# Patient Record
Sex: Female | Born: 1990 | Hispanic: Yes | Marital: Single | Smoking: Never smoker
Health system: Southern US, Community
[De-identification: ages and names within clinical notes are randomized; demographics above are authoritative.]

## PROBLEM LIST (undated history)

## (undated) ENCOUNTER — Emergency Department: Admission: EM

## (undated) ENCOUNTER — Inpatient Hospital Stay: Payer: Self-pay

## (undated) DIAGNOSIS — D649 Anemia, unspecified: Secondary | ICD-10-CM

## (undated) DIAGNOSIS — Z789 Other specified health status: Secondary | ICD-10-CM

## (undated) HISTORY — PX: CYST EXCISION: SHX5701

## (undated) HISTORY — DX: Anemia, unspecified: D64.9

---

## 2010-08-20 ENCOUNTER — Emergency Department: Payer: Self-pay | Admitting: Emergency Medicine

## 2010-09-18 ENCOUNTER — Emergency Department: Payer: Self-pay | Admitting: Unknown Physician Specialty

## 2010-11-18 ENCOUNTER — Ambulatory Visit: Payer: Self-pay | Admitting: Family Medicine

## 2011-01-16 ENCOUNTER — Observation Stay: Payer: Self-pay | Admitting: Obstetrics and Gynecology

## 2011-03-10 ENCOUNTER — Encounter: Payer: Self-pay | Admitting: Maternal and Fetal Medicine

## 2011-04-23 ENCOUNTER — Observation Stay: Payer: Self-pay | Admitting: Obstetrics and Gynecology

## 2011-05-15 ENCOUNTER — Observation Stay: Payer: Self-pay | Admitting: Obstetrics and Gynecology

## 2011-05-23 ENCOUNTER — Inpatient Hospital Stay: Payer: Self-pay

## 2011-11-04 ENCOUNTER — Emergency Department: Payer: Self-pay | Admitting: Emergency Medicine

## 2011-11-04 LAB — URINALYSIS, COMPLETE
Bilirubin,UR: NEGATIVE
Blood: NEGATIVE
Glucose,UR: NEGATIVE mg/dL (ref 0–75)
Leukocyte Esterase: NEGATIVE
Nitrite: NEGATIVE
RBC,UR: 1 /HPF (ref 0–5)
WBC UR: 1 /HPF (ref 0–5)

## 2011-11-04 LAB — COMPREHENSIVE METABOLIC PANEL
Alkaline Phosphatase: 103 U/L (ref 50–136)
Anion Gap: 12 (ref 7–16)
BUN: 14 mg/dL (ref 7–18)
Calcium, Total: 8.3 mg/dL — ABNORMAL LOW (ref 8.5–10.1)
Chloride: 105 mmol/L (ref 98–107)
Co2: 25 mmol/L (ref 21–32)
Creatinine: 0.53 mg/dL — ABNORMAL LOW (ref 0.60–1.30)
EGFR (African American): >60
EGFR (Non-African Amer.): >60
Glucose: 79 mg/dL (ref 65–99)
Osmolality: 283 (ref 275–301)

## 2011-11-04 LAB — CBC
HCT: 34.5 % — ABNORMAL LOW (ref 35.0–47.0)
MCHC: 33.6 g/dL (ref 32.0–36.0)
RBC: 4.12 10*6/uL (ref 3.80–5.20)
RDW: 13.7 % (ref 11.5–14.5)
WBC: 7.4 10*3/uL (ref 3.6–11.0)

## 2011-11-04 LAB — PREGNANCY, URINE: Pregnancy Test, Urine: NEGATIVE m[IU]/mL

## 2012-01-01 ENCOUNTER — Emergency Department: Payer: Self-pay | Admitting: Emergency Medicine

## 2012-01-01 LAB — URINALYSIS, COMPLETE
Glucose,UR: NEGATIVE mg/dL (ref 0–75)
Leukocyte Esterase: NEGATIVE
Nitrite: NEGATIVE
Ph: 5 (ref 4.5–8.0)
Protein: NEGATIVE
RBC,UR: <1 /HPF (ref 0–5)
Specific Gravity: 1.026 (ref 1.003–1.030)
WBC UR: 2 /HPF (ref 0–5)

## 2012-01-01 LAB — COMPREHENSIVE METABOLIC PANEL
Alkaline Phosphatase: 130 U/L (ref 50–136)
BUN: 11 mg/dL (ref 7–18)
Chloride: 106 mmol/L (ref 98–107)
Co2: 27 mmol/L (ref 21–32)
Creatinine: 0.42 mg/dL — ABNORMAL LOW (ref 0.60–1.30)
Osmolality: 274 (ref 275–301)
Potassium: 3.8 mmol/L (ref 3.5–5.1)
SGOT(AST): 16 U/L (ref 15–37)
SGPT (ALT): 18 U/L

## 2012-01-01 LAB — CBC
HCT: 38.1 % (ref 35.0–47.0)
HGB: 12.6 g/dL (ref 12.0–16.0)
MCV: 85 fL (ref 80–100)
RBC: 4.49 10*6/uL (ref 3.80–5.20)
RDW: 13.6 % (ref 11.5–14.5)
WBC: 7.1 10*3/uL (ref 3.6–11.0)

## 2012-01-01 LAB — LIPASE, BLOOD: Lipase: 102 U/L (ref 73–393)

## 2012-01-01 LAB — PREGNANCY, URINE: Pregnancy Test, Urine: NEGATIVE m[IU]/mL

## 2013-01-25 ENCOUNTER — Ambulatory Visit: Payer: Self-pay | Admitting: Advanced Practice Midwife

## 2013-06-29 ENCOUNTER — Inpatient Hospital Stay: Payer: Self-pay | Admitting: Obstetrics and Gynecology

## 2013-06-29 LAB — CBC WITH DIFFERENTIAL/PLATELET
Basophil #: 0.1 10*3/uL (ref 0.0–0.1)
Basophil %: 0.6 %
Eosinophil %: 1.7 %
HGB: 11.4 g/dL — ABNORMAL LOW (ref 12.0–16.0)
Lymphocyte #: 4.4 10*3/uL — ABNORMAL HIGH (ref 1.0–3.6)
Lymphocyte %: 41.5 %
MCHC: 35 g/dL (ref 32.0–36.0)
MCV: 81 fL (ref 80–100)
Monocyte #: 0.6 x10 3/mm (ref 0.2–0.9)
Monocyte %: 5.3 %
Neutrophil %: 50.9 %

## 2013-06-30 LAB — HEMOGLOBIN: HGB: 10.8 g/dL — ABNORMAL LOW (ref 12.0–16.0)

## 2013-08-25 HISTORY — PX: OVARIAN CYST REMOVAL: SHX89

## 2014-08-25 NOTE — L&D Delivery Note (Signed)
Delivery Note At 11:09 AM a viable female was delivered via  (Presentation:ROA , loose nuchal cord  ;  ).  APGAR: 8/9, ; weight  .   Placenta status:in tact  , .  Cord:  with the following complications: . None  Delayed cord clamping  Anesthesia: Epidural  Episiotomy: None Lacerations:  None  Suture Repair: none Est. Blood Loss (mL):  150 cc  Mom to postpartum.  Baby to Couplet care / Skin to Skin.  SCHERMERHORN,THOMAS 05/22/2015, 11:20 AM

## 2014-10-09 ENCOUNTER — Ambulatory Visit: Payer: Self-pay | Admitting: Obstetrics and Gynecology

## 2014-10-13 ENCOUNTER — Ambulatory Visit: Payer: Self-pay | Admitting: Obstetrics and Gynecology

## 2014-10-15 ENCOUNTER — Ambulatory Visit: Payer: Self-pay | Admitting: Obstetrics and Gynecology

## 2014-10-18 ENCOUNTER — Ambulatory Visit: Payer: Self-pay | Admitting: Obstetrics and Gynecology

## 2014-11-11 ENCOUNTER — Emergency Department: Payer: Self-pay | Admitting: Emergency Medicine

## 2014-11-24 LAB — HM PAP SMEAR: HM Pap smear: NEGATIVE

## 2014-11-25 LAB — OB RESULTS CONSOLE RPR: RPR: NONREACTIVE

## 2014-11-25 LAB — OB RESULTS CONSOLE ABO/RH: RH TYPE: POSITIVE

## 2014-11-25 LAB — OB RESULTS CONSOLE ANTIBODY SCREEN: Antibody Screen: NEGATIVE

## 2014-11-25 LAB — OB RESULTS CONSOLE HEPATITIS B SURFACE ANTIGEN: Hepatitis B Surface Ag: NEGATIVE

## 2014-11-25 LAB — OB RESULTS CONSOLE HIV ANTIBODY (ROUTINE TESTING): HIV: NONREACTIVE

## 2014-11-25 LAB — OB RESULTS CONSOLE GC/CHLAMYDIA: GC PROBE AMP, GENITAL: NEGATIVE

## 2014-12-24 NOTE — Op Note (Signed)
PATIENT NAME:  Cindy Fox, Cindy Fox MR#:  [id number] DATE OF BIRTH:  [birth year]  DATE OF PROCEDURE:  10/09/2014  PREOPERATIVE DIAGNOSIS: Suspected right ruptured ectopic pregnancy.  POSTOPERATIVE DIAGNOSIS: Ruptured right ovarian hemorrhagic cyst.   PROCEDURES PERFORMED: 1.  Exam under anesthesia. 2.  Exploratory laparoscopy.  3.  Evacuation of hemoperitoneum.  4.  Fulguration of bleeding on the right ovary.  SURGEON: Christeen Douglas, M.D.   ASSISTANT: Jennell Corner, MD   ANESTHESIA: General.   ESTIMATED BLOOD LOSS: Minimal for the case. Hemoperitoneum evacuated 600 mL of blood.  OPERATIVE FLUIDS: 700 mL.   URINE OUTPUT: 250 mL.   COMPLICATIONS: None.   FINDINGS: Normal ovaries and tubes bilaterally. Significant blood in the pelvis and in the bilateral upper quadrants. No appendix visualized, but notable adhesions in the right lower quadrant.  SPECIMEN TYPE: None.   CONDITION: Stable to PACU.   INDICATION FOR PROCEDURE: Ms. Cindy Fox is a 23 year old gravida 3, para 2-0-02 who was woken this morning by significant abdominal pain. She was found on imaging and exam in the Emergency Room to have significant hemoperitoneum and a 2 x 2 x 3 cm cyst on the right side with concerns for ectopic pregnancy. She was consented for a diagnostic laparoscopy and brought to the operating room in guarded condition. She did have a decrease in her hemoglobin from 11 to 9 while awaiting diagnosis and surgery.  DESCRIPTION OF PROCEDURE: The patient was taken to the operating room where she was identified by name and birthdate. She was placed under general anesthesia without difficulty. She was then repositioned in a dorsal lithotomy position and prepped and draped in the usual sterile fashion. A formal timeout procedure was performed with all team members present and in agreement.   A weighted speculum was placed in the vagina. The anterior lip of the cervix was grasped with a single-tooth  tenaculum after exam under anesthesia revealed no significant abnormalities and a small, mobile uterus. An acorn uterine manipulator was placed in the cervical canal and secured in place. The weighted speculum was removed from the vagina and the surgeon re-gloved.  Attention was turned to the abdomen where a supraumbilical incision was made. The peritoneal cavity was entered under direct visualization and significant blood was noted throughout. The 5 mm port was placed in the right lower quadrant and a 10 mm port was placed in the left lower quadrant under direct visualization. The patient was placed in Trendelenburg. Her pelvis was examined and evacuation of significant clots revealed bilateral adnexa with normal appearing fallopian tubes and a left ovary. On the right side, there was an approximately 3 x 3 cm portion of her right ovary that was actively bleeding. Kleppinger fulguration was used to control the bleeding at the site. The pelvis was irrigated fully. The patient was placed in reverse Trendelenburg to assist in draining the remaining blood down to her pelvis, which was then suctioned out. Attention was returned to the ovary. Small areas of bleeding were noted when the peritoneal pressure was reduced and the ovary was off tension. These areas again were fulgurated and the ovary watched for bleeding. When hemostasis was assured, Arista anticoagulant powder was placed on the ovary. All the blood that could be reached was suctioned out of the pelvis. Pictures were taken of the bilateral adnexa. The patient was leveled out and the trocars were removed under direct visualization. The camera port was closed at the fascial line using 0 Vicryl suture, and the skin was  closed using 4-0 Vicryl for excellent cosmetic effect. The patient tolerated these procedures well, general anesthesia was reversed without difficulty and she is stable in PAC-U.  The plan will be for overnight observation with a repeat of her  hemoglobin and serial beta hCGs.   Of note, a D and C was not performed as the patient had not been consented to this procedure.    ____________________________ Cline CoolsBethany E. Turki Tapanes, MD beb:sb D: 10/09/2014 12:30:02 ET T: 10/09/2014 14:16:10 ET JOB#: [id number]  cc: Cline CoolsBethany E. Brayn Eckstein, MD, <Dictator> Cline CoolsBETHANY E Khayree Delellis MD ELECTRONICALLY SIGNED 11/03/2014 7:37

## 2014-12-24 NOTE — H&P (Signed)
Subjective/Chief Complaint Abdominal pain   History of Present Illness 23yo G3P2002 at 4+1wks by LMP of 09/10/14 presenting with acute onset abdominal and lower pelvic pain at 0130 this morning. No vomiting, some nausea. No SOB or chest pain. No vaginal bleeding. Was trying to conceive. Her pain is crampy and sharp, radiates from midpelvis to RUQ, and is 10/10.   In ER, WBC elevated to 15, H/H stable on first draw at 11/34. Urine pregnancy test neg but serum beta 131.She is afebrile, not tachycardic and is normotensive. CT scan concerning for moderate hemoperitoneum with blood near the liver, a 2.3cm structure on the ovary which might be a corpus luteum cyst. Normal appendix. Ultrasound confirmed complex fluid throughout pelvis.   Outpatient Meds: None Allergies: NKDA   Past History PMH: None PSH: 2 NSVD Past Gyn Hx: No hx of STDs. Normal regular periods. Past OB hx: 2 NSVD at term, no complications, no bleeding   Code Status Full Code   Past Med/Surgical Hx:  Denies medical history:   denies:   Denies surgical history.:   ALLERGIES:  No Known Allergies:   Family and Social History:  Family History Non-Contributory   Place of Living Home   Review of Systems:  Fever/Chills No   Cough No   Sputum No   Abdominal Pain Yes   Diarrhea No   Constipation No   Nausea/Vomiting Yes   SOB/DOE No   Chest Pain No   Dysuria No   Physical Exam:  GEN in moderate distress   HEENT PERRL   RESP normal resp effort  clear BS   CARD regular rate   ABD positive tenderness  positive liver/spleen enlargement  Rovsing positive   SKIN normal to palpation, skin turgor good   NEURO motor/sensory function intact   PSYCH alert, A+O to time, place, person, good insight, anxious   Additional Comments Pelvic exam performed by ER physician with CMT, pain to palpation throughout pelvis, no vaginal bleeding, no cervical lesions.   Lab Results: Hepatic:  15-Feb-16 02:10    Bilirubin, Total  0.1  Alkaline Phosphatase 71  SGPT (ALT) 15  SGOT (AST) 22  Albumin, Serum  3.3  Routine Micro:  15-Feb-16 05:08   Micro Text Report WET PREP   COMMENT                   FEW WHITE BLOOD CELLS SEEN   COMMENT                   NO TRICHOMONAS,SPERMATOZOA,YEAST,OR CLUE CELLS SEEN   ANTIBIOTIC                       Micro Text Report CHLAM/N.GC RT-PCR ([facility])   CHLAMYDIA                 CHLAMYDIA TRACHOMATIS NEGATIVE   N.GONORRHOEAE             N.GONORRHOEAE NEGATIVE   ANTIBIOTIC                       Comment 2. NO TRICHOMONAS,SPERMATOZOA,YEAST,OR CLUE CELLS SEEN  Result(s) reported on 09 Oct 2014 at 05:30AM.  Routine Chem:  15-Feb-16 02:10   HCG Betasubunit Quant. Serum  131 (1-3  (International Unit)  ----------------- Non-pregnant <5 Weeks Post LMP mIU/mL  3- 4 wk 9 - 130  4- 5 wk 75 - 2,600  5- 6 wk 850 - 20,800  6- 7  wk 4,000 - 100,000  7-12 wk 11,500 - 289,000 12-16 wk 18,000 - 137,000 16-29 wk 1,400 - 53,000 29-41 wk 940 - 60,000)  BUN 15  Creatinine (comp) 0.72  Calcium (Total), Serum  8.1  Lipase 105 (Result(s) reported on 09 Oct 2014 at 03:06AM.)  Routine UA:  15-Feb-16 02:10   Color (UA) Yellow  Glucose (UA) 50 mg/dL  Ketones (UA) Trace  Specific Gravity (UA) 1.028  Blood (UA) Negative  Protein (UA) 100 mg/dL  Nitrite (UA) Negative  Leukocyte Esterase (UA) Negative (Result(s) reported on 09 Oct 2014 at 02:41AM.)  Routine Hem:  15-Feb-16 02:10   WBC (CBC)  15.2  Hemoglobin (CBC)  11.2  Hematocrit (CBC)  34.2  Platelet Count (CBC) 293    07:52   WBC (CBC)  12.4  Hemoglobin (CBC)  9.4  Hematocrit (CBC)  28.5  Platelet Count (CBC) 236 (Result(s) reported on 09 Oct 2014 at [facility]08:07AM.)   Radiology Results: US:    15-Feb-16 07:20, US OB Less Than 14 Weeks w/ Transvaginal  US OB Less Than 14 Weeks w/ Transvaginal  REASON FOR EXAM:    severe lower abd pain, hemoperitoneum, +HCG  COMMENTS:       PROCEDURE: US  - US OB LESS THAN 14  WEEKS/W TRANS  - Oct 09 2014  7:20AM     CLINICAL DATA:  Hemoperitoneum.  Positive beta HCG    EXAM:  OBSTETRIC <14 WK US AND TRANSVAGINAL OB US    TECHNIQUE:  Both transabdominal and transvaginal ultrasound examinations were  performed for complete evaluation of the gestation as well as the  maternal uterus, adnexal regions, and pelvic cul-de-sac.  Transvaginal technique was performed to assess early pregnancy.  COMPARISON:  CT abdomen and pelvis obtained earlier in the day    FINDINGS:  Intrauterine gestational sac: Not visualized    Yolk sac:  Not visualized    Embryo:  Not visualized    Maternal uterus/adnexae: There is no intrauterine mass or gestation  appreciable. The endometrium measures 15 mm in thickness with a  smooth contour. There is hemoperitoneum with complex fluid  throughout the pelvis. A small amount of fluid extends to the level  of the right hepatorenal fossa. It is difficult to completely  discern the ovaries from the surrounding hemorrhage. There is a  cystic structure in the right adnexum measuring 2.5 x 2.3 x 2.8 cm.  This structure most likely represents a corpus luteum. Conceivably,  it could represent a distorted gestational sac.     IMPRESSION:  No intrauterine gestation seen. Hemoperitoneum. Ectopic gestation  must be of concern in this circumstance. A cystic structure in the  right ovary may represent corpus luteum. Conceivably, a distorted  gestational sac could present in this manner.    Critical Value/emergent results were called by telephone at the time  of interpretation on 10/09/2014 at 7:40 am to Dr. Loleta RoseORY FORBACH , who  verbally acknowledged these results.    Electronically Signed    By: Bretta BangWilliam  Woodruff III M.D.    On: 10/09/2014 07:40         Verified By: Rutherford GuysWILLIAM W. WOODRUFF, M.D.,  CT:    15-Feb-16 04:53, CT Abdomen and Pelvis With Contrast  CT Abdomen and Pelvis With Contrast  REASON FOR EXAM:    (1) severe suprapubic pain w/  leukocytosis; (2)   severe suprapubic pain w/ leukoc  COMMENTS:       PROCEDURE: CT  - CT ABDOMEN / PELVIS  W  -  Oct 09 2014  4:53AM     CLINICAL DATA:  Suprapubic pain radiating to back, increased urinary  frequency. Similar symptoms 2 years ago without diagnosis.    EXAM:  CT ABDOMEN AND PELVIS WITH CONTRAST    TECHNIQUE:  Multidetector CT imaging of the abdomen and pelvis was performed  using the standard protocol following bolus administrationof  intravenous contrast.    CONTRAST:  100 cc Omnipaque 300    COMPARISON:  CT of the abdomen and pelvis November 04, 2011    FINDINGS:  LUNG BASES: Included view of the lung bases are clear. Visualized  heart and pericardium are unremarkable.    SOLIDORGANS: The liver, spleen, gallbladder, pancreas and adrenal  glands are unremarkable.    GASTROINTESTINAL TRACT: The stomach, small and large bowel are  normal in course and caliber without inflammatory changes. Enteric  contrast has not yet reachedthe distal small bowel. Normal  appendix.    KIDNEYS/ URINARY TRACT: Kidneys are orthotopic, demonstrating  symmetric enhancement. No nephrolithiasis, hydronephrosis or solid  renal masses. The unopacified ureters are normal in course and  caliber. Urinary bladder is partially distended and unremarkable.    PERITONEUM/RETROPERITONEUM: Aortoiliac vessels are normal in course  and caliber. No lymphadenopathy by CT size criteria. Moderate amount  of intermediate density fluid in the pelvis, trackingto the upper  quadrants. Apparent collapsing RIGHT adnexal cyst measuring up to  2.3 cm. However, fluid is predominately in the LEFT pelvis.  Prominent pelvic veins without definite contrast extravasation  though, not tailored for evaluation.  SOFT TISSUE/OSSEOUS STRUCTURES: Non-suspicious.     IMPRESSION:  Moderate amount of hemo peritoneum, Predominately in the pelvis,  tracking into the upper quadrant. Suspected RIGHT adnexal  ruptured  hemorrhagic cyst. Recommend pelvic ultrasound for further  characterization an, consider correlation with beta HCG to exclude  ruptured ectopic pregnancy.      Electronically Signed    By: Awilda Metro    On: 10/09/2014 05:06       Verified By: Dorris Carnes, M.D.,    Assessment/Admission Diagnosis Suspected ruptured ectopic pregnancy   Plan - Repeat CBC, serial vital signs - Consented for urgent OR: exploratory laparoscopy for evacutation of hemoperitoneum, removal of ectopic pregnancy with all associated procedures, including salpingectomy, oopherorectomy and laparotomy as needed. - IVF, pain control PRN. NPO.   Electronic Signatures: Cline Cools (MD)  (Signed 15-Feb-16 08:35)  Authored: CHIEF COMPLAINT and HISTORY, PAST MEDICAL/SURGIAL HISTORY, ALLERGIES, FAMILY AND SOCIAL HISTORY, REVIEW OF SYSTEMS, PHYSICAL EXAM, LABS, Radiology, ASSESSMENT AND PLAN   Last Updated: 15-Feb-16 08:35 by Cline Cools (MD)

## 2015-01-19 ENCOUNTER — Other Ambulatory Visit: Payer: Self-pay | Admitting: Advanced Practice Midwife

## 2015-01-19 DIAGNOSIS — Z3689 Encounter for other specified antenatal screening: Secondary | ICD-10-CM

## 2015-01-23 ENCOUNTER — Ambulatory Visit
Admission: RE | Admit: 2015-01-23 | Discharge: 2015-01-23 | Disposition: A | Discharge status: Home / Self Care | Payer: Medicaid Other | Source: Ambulatory Visit | Attending: Advanced Practice Midwife | Admitting: Advanced Practice Midwife

## 2015-01-23 DIAGNOSIS — Z36 Encounter for antenatal screening of mother: Secondary | ICD-10-CM | POA: Insufficient documentation

## 2015-01-23 DIAGNOSIS — Z3689 Encounter for other specified antenatal screening: Secondary | ICD-10-CM

## 2015-01-23 DIAGNOSIS — Z3A19 19 weeks gestation of pregnancy: Secondary | ICD-10-CM | POA: Insufficient documentation

## 2015-04-02 LAB — OB RESULTS CONSOLE HIV ANTIBODY (ROUTINE TESTING): HIV: NONREACTIVE

## 2015-04-02 LAB — HM HIV SCREENING LAB: HM HIV Screening: NEGATIVE

## 2015-05-15 ENCOUNTER — Other Ambulatory Visit: Payer: Self-pay | Admitting: Advanced Practice Midwife

## 2015-05-15 DIAGNOSIS — Z3493 Encounter for supervision of normal pregnancy, unspecified, third trimester: Secondary | ICD-10-CM

## 2015-05-18 ENCOUNTER — Observation Stay
Admission: EM | Admit: 2015-05-18 | Discharge: 2015-05-18 | Disposition: A | Discharge status: Home / Self Care | Payer: Self-pay | Attending: Obstetrics and Gynecology | Admitting: Obstetrics and Gynecology

## 2015-05-18 ENCOUNTER — Ambulatory Visit
Admission: RE | Admit: 2015-05-18 | Discharge: 2015-05-18 | Disposition: A | Discharge status: Home / Self Care | Payer: Medicaid Other | Source: Ambulatory Visit | Attending: Advanced Practice Midwife | Admitting: Advanced Practice Midwife

## 2015-05-18 ENCOUNTER — Encounter: Payer: Self-pay | Admitting: *Deleted

## 2015-05-18 DIAGNOSIS — Z36 Encounter for antenatal screening of mother: Secondary | ICD-10-CM | POA: Insufficient documentation

## 2015-05-18 DIAGNOSIS — IMO0002 Reserved for concepts with insufficient information to code with codable children: Secondary | ICD-10-CM

## 2015-05-18 DIAGNOSIS — Z3493 Encounter for supervision of normal pregnancy, unspecified, third trimester: Secondary | ICD-10-CM

## 2015-05-18 DIAGNOSIS — O365990 Maternal care for other known or suspected poor fetal growth, unspecified trimester, not applicable or unspecified: Secondary | ICD-10-CM | POA: Diagnosis present

## 2015-05-18 DIAGNOSIS — O365930 Maternal care for other known or suspected poor fetal growth, third trimester, not applicable or unspecified: Principal | ICD-10-CM | POA: Insufficient documentation

## 2015-05-18 DIAGNOSIS — Z3A36 36 weeks gestation of pregnancy: Secondary | ICD-10-CM | POA: Insufficient documentation

## 2015-05-18 HISTORY — DX: Other specified health status: Z78.9

## 2015-05-18 MED ORDER — BETAMETHASONE SOD PHOS & ACET 6 (3-3) MG/ML IJ SUSP
INTRAMUSCULAR | Status: AC
  Administered 2015-05-18: 12 mg via INTRAMUSCULAR
  Filled 2015-05-18: qty 1

## 2015-05-18 MED ORDER — BETAMETHASONE SOD PHOS & ACET 6 (3-3) MG/ML IJ SUSP
12.0000 mg | Freq: Once | INTRAMUSCULAR | Status: AC
  Administered 2015-05-18: 12 mg via INTRAMUSCULAR

## 2015-05-18 MED ORDER — BETAMETHASONE SOD PHOS & ACET 6 (3-3) MG/ML IJ SUSP: 12.0000 mg | Freq: Once | INTRAMUSCULAR | Status: DC

## 2015-05-18 NOTE — OB Triage Provider Note (Signed)
History     CSN: [id number]  Arrival date and time: 05/18/15 1631   None     Chief Complaint  Patient presents with  . Non-stress Test    IUGR   HPI: With interpreter at bedside  Pt. Is a 24 year old G3P2 at 35+[redacted] weeks gestation.  Received a call by Erroll Luna from [facility] Department for this patient with poor weight gain and growth ultrasound at [facility] on 05/18/15 showing EFW in the 5th% consistent with IUGR. Pt. Was sent to labor and delivery triage for an NST, BPP, and to schedule UA Dopplers.  Pt. Reports she was started on Ensure drinks for poor weight gain.  She reports she has had some persistent morning nausea throughout the pregnancy, but reports eating appropriate amounts of proteins, fruits, vegetables.  Pt. Denies being vegetarian.  Pt. And husband report that the other two children were around 7# when they were born.   OB History    Gravida Para Term Preterm AB TAB SAB Ectopic Multiple Living   Past Medical History  Diagnosis Date  . Medical history non-contributory     Past Surgical History  Procedure Laterality Date  . Ovarian cyst removal  2015    History reviewed. No pertinent family history.  Social History  Substance Use Topics  . Smoking status: Never Smoker   . Smokeless tobacco: None  . Alcohol Use: No    Allergies: No Known Allergies  Prescriptions prior to admission  Medication Sig Dispense Refill Last Dose  . Prenatal Vit-Fe Fumarate-FA (PRENATAL MULTIVITAMIN) TABS tablet Take 1 tablet by mouth daily at 12 noon.       Review of Systems  Constitutional: Negative for fever, chills, weight loss and malaise/fatigue.  HENT: Negative for congestion.   Eyes: Negative for blurred vision.  Respiratory: Negative for cough, shortness of breath and wheezing.   Cardiovascular: Negative for chest pain and palpitations.  Gastrointestinal: Positive for nausea. Negative for heartburn, vomiting, abdominal pain and  diarrhea.  Genitourinary: Negative for dysuria, urgency, frequency and hematuria.  Musculoskeletal: Negative for myalgias, back pain, joint pain, falls and neck pain.  Skin: Negative for itching and rash.  Neurological: Negative for dizziness, seizures and headaches.  Endo/Heme/Allergies: Does not bruise/bleed easily.  Psychiatric/Behavioral: Negative for depression. The patient is not nervous/anxious.   No vaginal bleeding / LOF +good fetal movement / occasional contractions with pelvic pressure - but nothing new   Physical Exam   Blood pressure 100/60, pulse 78, temperature 98.6 F (37 C), temperature source Oral, resp. rate 18, height  (1.676 m), weight 57.153 kg (126 lb), last menstrual period 09/10/2014.  Physical Exam  Constitutional: She is oriented to person, place, and time. She appears well-developed. No distress.  HENT:  Head: Normocephalic.  Eyes: Pupils are equal, round, and reactive to light.  Neck: Normal range of motion.  Cardiovascular: Normal rate and regular rhythm.  Exam reveals no gallop and no friction rub.   No murmur heard. Respiratory: Effort normal and breath sounds normal. No respiratory distress. She has no wheezes. She has no rales. She exhibits no tenderness.  GI: Soft. Bowel sounds are normal. She exhibits no distension. There is no tenderness.  Genitourinary: No vaginal discharge found.  Musculoskeletal: Normal range of motion. She exhibits no edema or tenderness.  Neurological: She is alert and oriented to person, place, and time. She has normal reflexes.  Skin: Skin is warm and dry.  Psychiatric: She has a normal mood and affect.   Fundal height: 31cm   [place] results reviewed:  Vtx Movement present HR: 133bmp AFI: WNL EFW: 2190g / 5th%  Fetal Monitoring Assessment: Baseline: 120bmp / Moderate variability / +15x15 accels / no decels Toco: irregular contractions     Procedures  Continuous Monitoring   Assessment and Plan  IUP at 35+5  weeks with IUGR in the 5th% Continuous Monitoring Discussed plan of care with Dr. Dalbert Garnet - Formal BPP and UA dopplers not capable at [facility] this evening, but modified BPP reassuring at this time Determine time/date of when we can schedule UA dopplers to monitor umbilical cord blood flow Betamethasone  IM x1, then repeat in 24 hours   05/18/15 @ 2030  1. IUP at 35+5 weeks with IUGR in the 5th% 2. Category 1 Fetal Monitoring tracing 3. Pt. To return to [facility] labor and delivery for 2nd betamethasone tomorrow night at 8:00pm 4. After reviewing all options and risks and benefits of needing the Doppler Study with patient and her husband via the interpreter which includes: going to [facility] triage tomorrow to have doppler studies done, coming back to [facility] for a [facility] consult and doppler study on Monday, or coming back to [facility] labor and delivery tomorrow during the day to have Dr. Dalbert Garnet and and ultrasound-trained nurse help perform Doppler Study - the patient and her husband elect to return to [facility] for a [facility] consult and doppler study on Monday because they only have 1 car for transportation and the patient's husband has to work during the days including weekends, in addition to having 2 small children at home  5. Discharge to home and return tomorrow for BMZ 6. FKC's daily and handout given 7. Preterm labor precautions reviewed 8. Will call patient Monday morning with the confirmation appointment with [facility]  Dr. Dalbert Garnet aware and updated and agrees and co-managed plan of care  Karena Addison CNM 05/18/2015, 9:02 PM

## 2015-05-18 NOTE — Progress Notes (Signed)
CNM at bedside with patient and interrupter.   EFM off.

## 2015-05-18 NOTE — OB Triage Note (Signed)
Sent from health department for severe IUGR with NST

## 2015-05-18 NOTE — Discharge Instructions (Signed)
Restricción del crecimiento intrauterino °(Intrauterine Growth Restriction) °Se refiere al crecimiento deficiente de un bebé en un momento del embarazo o al nacer. No debe confundirse con "pequeño para la edad gestacional", que significa que el peso del bebé al nacer está en el extremo más bajo) menos del 10% de la curva de pesos normales.  °CAUSAS °Problemas médicos en la madre: °· Presión arterial elevada. °· Enfermedad renal, pulmonar o cardíaca. °· Diabetes con arteriosclerosis. °· Hemoglobinopatías, enfermedades de la sangre. °· Síndrome de anticuerpos antifosfolípidos, un trastorno del sistema inmunológico. °Otras causas: °· Consumo de drogas, hábito de fumar e ingesta de alcohol excesiva. °· Enfermedades de la placenta. °· Embarazo de gemelos o más bebés. °· Desnutrición °· Infecciones. °· Problemas genéticos. °· Embarazo a los 16 años o menos, o en mujeres de más de 35 años. °· Exposición a sustancias químicas. °SÍNTOMAS °· Útero más pequeño que lo normal, al medir su tamaño sobre el abdomen. °· Si al medir por medio de una ecografía de la circunferencia cefálica del feto, la circunferencia abdominal, el diámetro del área biparietal (lados) y el largo del fémur se obtienen valores menores a los normales, hay indicios de restricción del desarrollo intrauterino. °RIESGOS Y COMPLICACIONES °· Muerte fetal dentro del útero o bebé nacido muerto. °· Insuficiencia de líquido amniótico en la bolsa del bebé (oligohidramnios). °· Problemas en la frecuencia cardíaca fetal. Esto ocasiona más partos por cesárea. °· Bajas puntuaciones de Apgar (evaluación del estado del bebé en el momento del nacimiento). °· Aumento en la acidez de la sangre del bebé (acidosis). °Los niños de bajo peso al nacer también pueden sufrir las siguientes complicaciones: °· Bajo nivel de azúcar en la sangre. °· Aumento de la bilirrubina. °· Baja temperatura corporal (hipotermia). °· Bajas puntuaciones de Apgar, convulsiones, muerte fetal y muerte  al nacer. °TRATAMIENTO °· Controles y seguimiento estrechos del feto durante el embarazo. °· Tratamiento del las infecciones, si existieran. °· Tratamiento y control de las enfermedades existentes. °· Observación del estado del feto con pruebas de no estrés, pruebas de estrés por contracciones y perfil biofísico del feto. °· La ecografía Doppler (medición del flujo de la arteria umbilical) disminuye el riesgo de muerte fetal y muerte al nacer, permitiendo un parto anticipado en caso de ser anormal. °INSTRUCCIONES PARA EL CUIDADO DOMICILIARIO °· Siga los consejos y las instrucciones de su médico, y cumpla con todos los controles prenatales. °· Descanse y duerma lo suficiente. °· Consuma una dieta balanceada y tome todas sus suplementos de vitaminas y minerales. °· No haga un uso excesivo de su energía en actividades físicas, trabajo o actividades domésticas extenuantes. °· No practique actividad física excepto que el médico la autorice. °· No fume, no beba alcohol, ni consuma drogas. °· Evite el contacto con sustancias químicas, como pesticidas. °SOLICITE ATENCIÓN MÉDICA SI: °· La temperatura se eleva por encima de 100° F (37.9° C). °· No siente los movimientos del bebé o estos son menos frecuentes. °· Tiene una pérdida de líquido por la vagina. °· Presenta una hemorragia vaginal abundante. °· Siente dolor abdominal. °· Tiene contracciones uterinas. °Document Released: 07/24/2008 Document Revised: 11/03/2011 °Exit[employer]® Patient Information ©2015 [employer]. This information is not intended to replace advice given to you by your health care provider. Make sure you discuss any questions you have with your health care provider. ° °

## 2015-05-18 NOTE — Progress Notes (Signed)
Pt given 1st dose of BMZ in right hip.   Instructions given in spanish to come back tomorrow night for dose #2.

## 2015-05-18 NOTE — Progress Notes (Signed)
Pt given d/c instructions with interrupter.   She is aware to come back tomorrow night for another dose. CNM will call her on Monday to come in around 4pm to see [facility] here.   Pt was in stable condition and ambulated out with husband and her children.

## 2015-05-19 ENCOUNTER — Ambulatory Visit
Admission: RE | Admit: 2015-05-19 | Discharge: 2015-05-19 | Disposition: A | Discharge status: Home / Self Care | Payer: Self-pay | Source: Ambulatory Visit | Attending: Obstetrics and Gynecology | Admitting: Obstetrics and Gynecology

## 2015-05-19 DIAGNOSIS — O365930 Maternal care for other known or suspected poor fetal growth, third trimester, not applicable or unspecified: Secondary | ICD-10-CM | POA: Insufficient documentation

## 2015-05-19 DIAGNOSIS — Z3A35 35 weeks gestation of pregnancy: Secondary | ICD-10-CM | POA: Insufficient documentation

## 2015-05-19 MED ORDER — BETAMETHASONE SOD PHOS & ACET 6 (3-3) MG/ML IJ SUSP
12.0000 mg | Freq: Once | INTRAMUSCULAR | Status: AC
  Administered 2015-05-19: 12 mg via INTRAMUSCULAR

## 2015-05-19 NOTE — Progress Notes (Signed)
Pt, spouse and sons left department walking to Er with Unit secretary.

## 2015-05-19 NOTE — Progress Notes (Signed)
Pt reporting for her medication appointment re:: second Betamethasone injection. Pt and spouse everything is fine, no problems , for [place] on Monday.

## 2015-05-19 NOTE — ED Notes (Signed)
Pt arrived as directed by MD last evening for "steroid injection"; taken to labor and delivery unit

## 2015-05-21 ENCOUNTER — Ambulatory Visit
Admission: RE | Admit: 2015-05-21 | Discharge: 2015-05-21 | Disposition: A | Discharge status: Home / Self Care | Payer: Self-pay | Source: Ambulatory Visit | Attending: Obstetrics and Gynecology | Admitting: Obstetrics and Gynecology

## 2015-05-21 ENCOUNTER — Inpatient Hospital Stay
Admission: EM | Admit: 2015-05-21 | Discharge: 2015-05-23 | DRG: 775 | Disposition: A | Discharge status: Home / Self Care | Payer: Medicaid Other | Attending: Obstetrics and Gynecology | Admitting: Obstetrics and Gynecology

## 2015-05-21 ENCOUNTER — Encounter: Payer: Self-pay | Admitting: Obstetrics and Gynecology

## 2015-05-21 VITALS — BP 98/62 | HR 78 | Temp 98.2°F | Wt 125.0 lb

## 2015-05-21 DIAGNOSIS — O365930 Maternal care for other known or suspected poor fetal growth, third trimester, not applicable or unspecified: Secondary | ICD-10-CM | POA: Insufficient documentation

## 2015-05-21 DIAGNOSIS — O4103X0 Oligohydramnios, third trimester, not applicable or unspecified: Secondary | ICD-10-CM | POA: Insufficient documentation

## 2015-05-21 DIAGNOSIS — Z3A35 35 weeks gestation of pregnancy: Secondary | ICD-10-CM | POA: Insufficient documentation

## 2015-05-21 DIAGNOSIS — O365990 Maternal care for other known or suspected poor fetal growth, unspecified trimester, not applicable or unspecified: Secondary | ICD-10-CM | POA: Diagnosis present

## 2015-05-21 LAB — CBC
HCT: 33.1 % — ABNORMAL LOW (ref 35.0–47.0)
Hemoglobin: 10.8 g/dL — ABNORMAL LOW (ref 12.0–16.0)
MCH: 28.4 pg (ref 26.0–34.0)
MCHC: 32.8 g/dL (ref 32.0–36.0)
MCV: 86.6 fL (ref 80.0–100.0)
Platelets: 231 10*3/uL (ref 150–440)
RBC: 3.82 MIL/uL (ref 3.80–5.20)
RDW: 13.6 % (ref 11.5–14.5)
WBC: 10.9 10*3/uL (ref 3.6–11.0)

## 2015-05-21 LAB — TYPE AND SCREEN
ABO/RH(D): B POS
ANTIBODY SCREEN: NEGATIVE

## 2015-05-21 LAB — ABO/RH: ABO/RH(D): B POS

## 2015-05-21 MED ORDER — SODIUM CHLORIDE 0.9 % IV SOLN
INTRAVENOUS | Status: AC
  Administered 2015-05-21: 23:00:00
  Filled 2015-05-21: qty 2000

## 2015-05-21 MED ORDER — OXYTOCIN 40 UNITS IN LACTATED RINGERS INFUSION - SIMPLE MED
62.5000 mL/h | INTRAVENOUS | Status: DC
  Filled 2015-05-21: qty 1000

## 2015-05-21 MED ORDER — BUTORPHANOL TARTRATE 1 MG/ML IJ SOLN: 1.0000 mg | INTRAMUSCULAR | Status: DC | PRN

## 2015-05-21 MED ORDER — LACTATED RINGERS IV SOLN
INTRAVENOUS | Status: DC
  Administered 2015-05-21 – 2015-05-22 (×2): 1000 mL via INTRAVENOUS
  Administered 2015-05-22: 02:00:00 via INTRAVENOUS

## 2015-05-21 MED ORDER — ONDANSETRON HCL 4 MG/2ML IJ SOLN: 4.0000 mg | Freq: Four times a day (QID) | INTRAMUSCULAR | Status: DC | PRN

## 2015-05-21 MED ORDER — LIDOCAINE HCL (PF) 1 % IJ SOLN: 30.0000 mL | INTRAMUSCULAR | Status: DC | PRN

## 2015-05-21 MED ORDER — LACTATED RINGERS IV SOLN: 500.0000 mL | INTRAVENOUS | Status: DC | PRN

## 2015-05-21 MED ORDER — OXYTOCIN BOLUS FROM INFUSION: 500.0000 mL | INTRAVENOUS | Status: DC

## 2015-05-21 MED ORDER — ACETAMINOPHEN 325 MG PO TABS: 650.0000 mg | ORAL_TABLET | ORAL | Status: DC | PRN

## 2015-05-21 NOTE — H&P (Signed)
Obstetrics Admission History & Physical  Referring Provider:[facility] Laverda Sorenson Primary OBGYN: [facility]   Chief Complaint: Seen at [facility] today with a AFI of 4 and BPP of 4/10 and EFW 25% vs IUGR severe < 5th %. Dopplers present. Pt states she started leaking clear fluid after her [place] on Friday.  History of Present Illness  24 y.o. [id number] @ [redacted]w[redacted]d (Dating: L 8 4/7  With EDD of 06/20/15. Pregnancy complicated by: close interconceptual spacing, poor weight gain, NOW: OLIGO and Non-Reassuring Fetal Well Being with BPP of 4/10. No VB, labor pattern or decreased FM today.   Ms. Cindy Fox presents for IOL due to Oligo and NRFWB with BPP of 4/10.   Review of Systems: Positive for  occas UC and +Nitrazine but, no fluid seen from the vagina.   Otherwise, her 12 point review of systems is negative or as noted in the History of Present Illness.  Patient Active Problem List   Diagnosis Date Noted  . Indication for care in labor or delivery 05/21/2015  . IUGR (intrauterine growth restriction) affecting care of mother 05/18/2015    PMHx:  Past Medical History  Diagnosis Date  . Medical history non-contributory    PSHx:  Past Surgical History  Procedure Laterality Date  . Ovarian cyst removal  2015   Medications:  Prescriptions prior to admission  Medication Sig Dispense Refill Last Dose  . Prenatal Vit-Fe Fumarate-FA (PRENATAL MULTIVITAMIN) TABS tablet Take 1 tablet by mouth daily at 12 noon.      Allergies: has No Known Allergies. OBHx:  OB History  Gravida Para Term Preterm AB SAB TAB Ectopic Multiple Living  # Outcome Date GA Lbr Len/2nd Weight Sex Delivery Anes PTL Lv  3 Current           2 Term 06/29/13    Heide Scales  1 Term 05/25/11    M Vag-Spont   Y     GYNHx:  History of abnormal pap smears: No. History of STIs: No..             FHx: No family history on file. Soc Hx:  Social History   Social History  . Marital Status:  Single    Spouse Name: N/A  . Number of Children: N/A  . Years of Education: N/A   Occupational History  . Not on file.   Social History Main Topics  . Smoking status: Never Smoker   . Smokeless tobacco: Not on file  . Alcohol Use: No  . Drug Use: No  . Sexual Activity: Yes   Other Topics Concern  . Not on file   Social History Narrative   FOB is involved with the pregnancy   Objective   Filed Vitals:   05/21/15 2000  BP: 100/61  Pulse: 86  Temp: 98.4 F (36.9 C)  Resp: 18   Temp:  [98.2 F (36.8 C)-98.4 F (36.9 C)] 98.4 F (36.9 C) (09/26 2000) Pulse Rate:  [78-86] 86 (09/26 2000) Resp:  [18-20] 18 (09/26 2000) BP: (98-100)/(61-62) 100/61 mmHg (09/26 2000) SpO2:  [99 %] 99 % (09/26 1453) Weight:  [125 lb (56.7 kg)] 125 lb (56.7 kg) (09/26 1735) Temp (24hrs), Avg:98.3 F (36.8 C), Min:98.2 F (36.8 C), Max:98.4 F (36.9 C)  No intake or output data in the 24 hours ending 05/21/15 2037    Current Vital Signs 24h Vital Sign Ranges  T 98.4 F (36.9 C) Temp  Avg: 98.3 F (36.8 C)  Min: 98.2 F (36.8 C)  Max: 98.4 F (36.9 C)  BP 100/61 mmHg BP  Min: 98/62  Max: 100/61  HR 86 Pulse  Avg: 82  Min: 78  Max: 86  RR 18 Resp  Avg: 19  Min: 18  Max: 20  SaO2     SpO2  Avg: 99 %  Min: 99 %  Max: 99 %       24 Hour I/O Current Shift I/O  Time Ins Outs       EFM:150  Toco: irreg UC's  General: Well nourished, well developed female in no acute distress.  Skin:  Warm and dry.  Cardiovascular: S1S2, RRR, No M/R/G. Respiratory:  Clear to auscultation bilateral. Normal respiratory effort. No W/R/R. Abdomen:Gravid, EFW 5#2 oz Neuro/Psych:  Normal mood and affect.    SVE:3/80%/vtx-2 Leopolds/EFW: 5#2  Labs  B Pos/Antibody neg, RPR NR, GC/CH neg/HIV NR, Quad declined/Neg UDS Ultrasounds 25% today on [place] at [facility], AFI 4, BPP 4/10  Perinatal info  Rubella immune/Varicella immune/GBS unknown  Assessment & Plan  A:  24 y.o. [id number] @ [redacted]w[redacted]d with initially  IUGE severe per ACHD < 5% which was reviewed at [facility] and BPP was 4/10 and AFI of Oligo which necessitates IOL this pm.  IUP at 35 5/7 weeks with non-reassuring FWB and need for IOL GBS unknown P: Discussed with Dr Feliberto Gottron and recommendation to induce labor with AROM or Pitocin as needed to deliver pt. 2. Fetal and Uterine monitors till delivered. 3. Ampicillin 2 gms IV and to follow Amp 1 gm IV q 4 hours till delivered. 4. Will AROM when 1st dose in and prior to 2nd dose. 5. Perinatal NP to be at delivery.    Sharee Pimple, MSN, CNM, FNP [facility]

## 2015-05-21 NOTE — OB Triage Note (Signed)
Sent from [facility] per MD order for monitoring and rule out ROM.  Pt seen today for ultrasound for AFI in clinic.  Pt states that she has been leakiing fluid since Friday. Abdomen tender to touch, Afebrile. Monitors applied. Provider on call notified.

## 2015-05-22 ENCOUNTER — Inpatient Hospital Stay: Payer: Medicaid Other | Admitting: Anesthesiology

## 2015-05-22 DIAGNOSIS — O365990 Maternal care for other known or suspected poor fetal growth, unspecified trimester, not applicable or unspecified: Secondary | ICD-10-CM

## 2015-05-22 LAB — RPR: RPR: NONREACTIVE

## 2015-05-22 MED ORDER — BENZOCAINE-MENTHOL 20-0.5 % EX AERO: 1.0000 "application " | INHALATION_SPRAY | CUTANEOUS | Status: DC | PRN

## 2015-05-22 MED ORDER — ONDANSETRON HCL 4 MG/2ML IJ SOLN: 4.0000 mg | INTRAMUSCULAR | Status: DC | PRN

## 2015-05-22 MED ORDER — SENNOSIDES-DOCUSATE SODIUM 8.6-50 MG PO TABS
2.0000 | ORAL_TABLET | ORAL | Status: DC
  Administered 2015-05-22: 2 via ORAL
  Filled 2015-05-22: qty 2

## 2015-05-22 MED ORDER — LANOLIN HYDROUS EX OINT: TOPICAL_OINTMENT | CUTANEOUS | Status: DC | PRN

## 2015-05-22 MED ORDER — ONDANSETRON HCL 4 MG PO TABS: 4.0000 mg | ORAL_TABLET | ORAL | Status: DC | PRN

## 2015-05-22 MED ORDER — ACETAMINOPHEN 325 MG PO TABS
650.0000 mg | ORAL_TABLET | ORAL | Status: DC | PRN
Start: 2015-05-22 — End: 2015-05-23
  Administered 2015-05-23: 650 mg via ORAL
  Filled 2015-05-22: qty 2

## 2015-05-22 MED ORDER — DIPHENHYDRAMINE HCL 25 MG PO CAPS: 25.0000 mg | ORAL_CAPSULE | Freq: Four times a day (QID) | ORAL | Status: DC | PRN

## 2015-05-22 MED ORDER — FENTANYL 2.5 MCG/ML W/ROPIVACAINE 0.2% IN NS 100 ML EPIDURAL INFUSION (ARMC-ANES)
EPIDURAL | Status: AC
  Administered 2015-05-22: 9 mL/h via EPIDURAL
  Filled 2015-05-22: qty 100

## 2015-05-22 MED ORDER — PHENYLEPHRINE 40 MCG/ML (10ML) SYRINGE FOR IV PUSH (FOR BLOOD PRESSURE SUPPORT)
80.0000 ug | PREFILLED_SYRINGE | INTRAVENOUS | Status: DC | PRN
  Filled 2015-05-22: qty 2

## 2015-05-22 MED ORDER — FENTANYL 2.5 MCG/ML W/ROPIVACAINE 0.2% IN NS 100 ML EPIDURAL INFUSION (ARMC-ANES): 9.0000 mL/h | EPIDURAL | Status: DC

## 2015-05-22 MED ORDER — WITCH HAZEL-GLYCERIN EX PADS: 1.0000 "application " | MEDICATED_PAD | CUTANEOUS | Status: DC | PRN

## 2015-05-22 MED ORDER — LIDOCAINE-EPINEPHRINE (PF) 1.5 %-1:200000 IJ SOLN
INTRAMUSCULAR | Status: DC | PRN
  Administered 2015-05-22: 3 mL via EPIDURAL

## 2015-05-22 MED ORDER — SIMETHICONE 80 MG PO CHEW: 80.0000 mg | CHEWABLE_TABLET | ORAL | Status: DC | PRN

## 2015-05-22 MED ORDER — AMMONIA AROMATIC IN INHA
RESPIRATORY_TRACT | Status: AC
  Filled 2015-05-22: qty 10

## 2015-05-22 MED ORDER — SODIUM CHLORIDE 0.9 % IV SOLN
1.0000 g | INTRAVENOUS | Status: DC
  Administered 2015-05-22 (×2): 1 g via INTRAVENOUS
  Filled 2015-05-22 (×8): qty 1000

## 2015-05-22 MED ORDER — DIBUCAINE 1 % RE OINT: 1.0000 "application " | TOPICAL_OINTMENT | RECTAL | Status: DC | PRN

## 2015-05-22 MED ORDER — BUPIVACAINE HCL (PF) 0.25 % IJ SOLN
INTRAMUSCULAR | Status: DC | PRN
  Administered 2015-05-22: 3 mL via EPIDURAL
  Administered 2015-05-22: 5 mL via EPIDURAL

## 2015-05-22 MED ORDER — INFLUENZA VAC SPLIT QUAD 0.5 ML IM SUSY: 0.5000 mL | PREFILLED_SYRINGE | Freq: Once | INTRAMUSCULAR | Status: DC

## 2015-05-22 MED ORDER — EPHEDRINE 5 MG/ML INJ
10.0000 mg | INTRAVENOUS | Status: DC | PRN
  Filled 2015-05-22: qty 2

## 2015-05-22 MED ORDER — FERROUS SULFATE 325 (65 FE) MG PO TABS
325.0000 mg | ORAL_TABLET | Freq: Two times a day (BID) | ORAL | Status: DC
  Administered 2015-05-23 (×2): 325 mg via ORAL
  Filled 2015-05-22 (×2): qty 1

## 2015-05-22 MED ORDER — OXYTOCIN 10 UNIT/ML IJ SOLN
INTRAMUSCULAR | Status: AC
  Filled 2015-05-22: qty 2

## 2015-05-22 MED ORDER — ZOLPIDEM TARTRATE 5 MG PO TABS: 5.0000 mg | ORAL_TABLET | Freq: Every evening | ORAL | Status: DC | PRN

## 2015-05-22 MED ORDER — OXYTOCIN 40 UNITS IN LACTATED RINGERS INFUSION - SIMPLE MED
62.5000 mL/h | INTRAVENOUS | Status: DC
  Administered 2015-05-22: 62.5 mL/h via INTRAVENOUS

## 2015-05-22 MED ORDER — LIDOCAINE HCL (PF) 1 % IJ SOLN
INTRAMUSCULAR | Status: AC
  Filled 2015-05-22: qty 30

## 2015-05-22 MED ORDER — DIPHENHYDRAMINE HCL 50 MG/ML IJ SOLN: 12.5000 mg | INTRAMUSCULAR | Status: DC | PRN

## 2015-05-22 MED ORDER — IBUPROFEN 600 MG PO TABS
600.0000 mg | ORAL_TABLET | Freq: Four times a day (QID) | ORAL | Status: DC
  Administered 2015-05-22 – 2015-05-23 (×4): 600 mg via ORAL
  Filled 2015-05-22 (×5): qty 1

## 2015-05-22 MED ORDER — MAGNESIUM HYDROXIDE 400 MG/5ML PO SUSP: 30.0000 mL | ORAL | Status: DC | PRN

## 2015-05-22 MED ORDER — PRENATAL MULTIVITAMIN CH
1.0000 | ORAL_TABLET | Freq: Every day | ORAL | Status: DC
  Administered 2015-05-23: 1 via ORAL
  Filled 2015-05-22: qty 1

## 2015-05-22 MED ORDER — MEASLES, MUMPS & RUBELLA VAC ~~LOC~~ INJ
0.5000 mL | INJECTION | Freq: Once | SUBCUTANEOUS | Status: DC
  Filled 2015-05-22: qty 0.5

## 2015-05-22 MED ORDER — MISOPROSTOL 200 MCG PO TABS
ORAL_TABLET | ORAL | Status: AC
  Filled 2015-05-22: qty 4

## 2015-05-22 MED ORDER — OXYTOCIN 40 UNITS IN LACTATED RINGERS INFUSION - SIMPLE MED: 62.5000 mL/h | INTRAVENOUS | Status: DC | PRN

## 2015-05-22 MED ORDER — OXYTOCIN 40 UNITS IN LACTATED RINGERS INFUSION - SIMPLE MED
1.0000 m[IU]/min | INTRAVENOUS | Status: DC
  Administered 2015-05-22: 1 m[IU]/min via INTRAVENOUS

## 2015-05-22 MED ORDER — AMPICILLIN SODIUM 2 G IJ SOLR: 2.0000 g | Freq: Once | INTRAMUSCULAR | Status: DC

## 2015-05-22 NOTE — Progress Notes (Signed)
Spanish interpreter, hiram, in to translate

## 2015-05-22 NOTE — Progress Notes (Signed)
S: Resting comfortably with occas CTX, no LOF, VB,decreased FM. O: Filed Vitals:   05/21/15 2000 05/21/15 2146 05/21/15 2149 05/21/15 2350  BP: 100/61  107/67   Pulse: 86  77   Temp: 98.4 F (36.9 C) 98.5 F (36.9 C)  98.4 F (36.9 C)  TempSrc: Oral Oral  Oral  Resp: 18  16   Height:      Weight:         Gen: NAD, AAOx3      Abd: FNTTP      Ext: Non-tender, Nonedmeatous    FHT: 130 mod var + accelerations no decelerations, Cat 1 TOCO: Q 4-5 min SVE:3/80/vtx-2   A/P:  24 y.o. yo G3P2002 at [redacted]w[redacted]d for IOL  Labor: IOL for Non-reassuring FWB, OLIGO  FWB: Reassuring Cat 1 tracing  WUJ:[id number]       ABX for coverage of GBS   Sharee Pimple 12:23 AM

## 2015-05-22 NOTE — Progress Notes (Signed)
[place] of GB indicates no biliary dilations or cholelithiasis. Equivocal increased hepatic echogenicity, may reflect mild steatosis. No gallstones or wall thickening, Suspect minimal sludge. No murphy sign. CBE: 3 mm and normal. BP 86/53 to 101/59. No further signs of pre-ecclampsia noted.

## 2015-05-22 NOTE — Anesthesia Procedure Notes (Signed)
Epidural Patient location during procedure: OB Start time: 05/22/2015 10:22 AM End time: 05/22/2015 10:24 AM  Staffing Resident/CRNA: Junious Silk Performed by: resident/CRNA   Preanesthetic Checklist Completed: patient identified, site marked, surgical consent, pre-op evaluation, timeout performed, IV checked, risks and benefits discussed and monitors and equipment checked  Epidural Patient position: sitting Prep: Betadine Patient monitoring: heart rate, continuous pulse ox and blood pressure Approach: midline Location: L4-L5 Injection technique: LOR air  Needle:  Needle type: Tuohy  Needle gauge: 18 G Needle length: 9 cm and 9 Needle insertion depth: 7 cm Catheter type: closed end flexible Catheter size: 20 Guage Catheter at skin depth: 11 cm Test dose: negative and 1.5% lidocaine with Epi 1:200 K  Assessment Sensory level: T10 Events: blood not aspirated, injection not painful, no injection resistance, negative IV test and no paresthesia  Additional Notes Pt's history reviewed and consent obtained as per OB consent Patient tolerated the insertion well without complications. Negative SATD, negative IVTD All VSS were obtained and monitored through OBIX and nursing protocols followed.Reason for block:procedure for pain

## 2015-05-22 NOTE — Progress Notes (Addendum)
S: Resting comfortably. O: UC: q 11 mins, lasts 75 secs, mild  To palp FHT: 130, bno decels, +accels, mod variability Cx: 3/80/vtx-2 AROM for clear fluid with FHR of 120. Tol well. Interpreter in for procedure.  VSS. Afebrile A: 1. OLIGO at 35 6/7 weeks 2. BPP 4/10 P: IOL planned due to LOF since Friday with OLIGO & BPP of 4/10 2. AROM performed and will start Pitocin per protocol. 3. Dr Feliberto Gottron aware and agrees with plan of care.

## 2015-05-22 NOTE — Lactation Note (Signed)
This note was copied from the chart of Cindy Fox. Lactation Consultation Note  Patient Name: Cindy Fox [id number] Date: 05/22/2015 Reason for consult: Initial assessment   Maternal Data  P3 who states she breastfed two other children without giving formula. She denies having any problems. She states this baby nurses well too. Mom declined interpreter and wanted FOB to translate.   Feeding Feeding Type: Breast Fed 356/7 weeks. Mom was already instructed to nurse baby at least every 2-3 hours; to call for help if unable to feed baby. FOB verbalized info back to me. During this feeding, Mom positioned baby correctly and got baby latched on correctly. He was a little sleepy, so I showed parents how to stimulate baby a little and massage mom's breasts to get him to feed longer and a bit better. To not let him only sleep at breast, but to actively suck so that they hear swallows for at elast 10 minutes or more. . Mom hand expressed colostrum to get baby started. She followed all instructions correctly. They will need close follow up as many later preemies need extra help with some pumping/hand expression, etc.  to obtain/maintain good milk supply.  LATCH Score/Interventions Latch: Grasps breast easily, tongue down, lips flanged, rhythmical sucking.  Audible Swallowing: A few with stimulation Intervention(s): Alternate breast massage  Type of Nipple: Everted at rest and after stimulation  Comfort (Breast/Nipple): Soft / non-tender     Hold (Positioning): No assistance needed to correctly position infant at breast.  LATCH Score: 9  Lactation Tools Discussed/Used     Consult Status Consult Status: Follow-up Date: 05/23/15 Follow-up type: In-patient    Sunday Corn 05/22/2015, 4:25 PM

## 2015-05-22 NOTE — Anesthesia Preprocedure Evaluation (Signed)
Anesthesia Evaluation  Patient identified by MRN, date of birth, ID band Patient awake    Reviewed: Allergy & Precautions, H&P , NPO status , Patient's Chart, lab work & pertinent test results, reviewed documented beta blocker date and time   Airway Mallampati: II  TM Distance: >3 FB Neck ROM: full    Dental no notable dental hx. (+) Teeth Intact   Pulmonary neg pulmonary ROS,    Pulmonary exam normal breath sounds clear to auscultation       Cardiovascular Exercise Tolerance: Good negative cardio ROS Normal cardiovascular exam Rhythm:regular Rate:Normal     Neuro/Psych negative neurological ROS  negative psych ROS   GI/Hepatic negative GI ROS, Neg liver ROS,   Endo/Other  negative endocrine ROS  Renal/GU negative Renal ROS  negative genitourinary   Musculoskeletal   Abdominal   Peds  Hematology negative hematology ROS (+)   Anesthesia Other Findings   Reproductive/Obstetrics (+) Pregnancy                             Anesthesia Physical Anesthesia Plan  ASA: II  Anesthesia Plan: Regional and Epidural   Post-op Pain Management:    Induction:   Airway Management Planned:   Additional Equipment:   Intra-op Plan:   Post-operative Plan:   Informed Consent: I have reviewed the patients History and Physical, chart, labs and discussed the procedure including the risks, benefits and alternatives for the proposed anesthesia with the patient or authorized representative who has indicated his/her understanding and acceptance.     Plan Discussed with: CRNA  Anesthesia Plan Comments:         Anesthesia Quick Evaluation  

## 2015-05-23 LAB — CBC
HCT: 32.7 % — ABNORMAL LOW (ref 35.0–47.0)
HEMOGLOBIN: 11.4 g/dL — AB (ref 12.0–16.0)
MCH: 30 pg (ref 26.0–34.0)
MCHC: 34.8 g/dL (ref 32.0–36.0)
MCV: 86.1 fL (ref 80.0–100.0)
PLATELETS: 220 10*3/uL (ref 150–440)
RBC: 3.79 MIL/uL — AB (ref 3.80–5.20)
RDW: 13.5 % (ref 11.5–14.5)
WBC: 10.7 10*3/uL (ref 3.6–11.0)

## 2015-05-23 MED ORDER — IBUPROFEN 600 MG PO TABS: 600.0000 mg | ORAL_TABLET | Freq: Four times a day (QID) | ORAL | Status: DC

## 2015-05-23 NOTE — Progress Notes (Signed)
Interpreter herim used for discharge instructions

## 2015-05-23 NOTE — Discharge Summary (Signed)
Obstetric Discharge Summary Reason for Admission: onset of labor, bpp 4/10 , possible iugr, 35+5 weeks  Prenatal Procedures: none Intrapartum Procedures: spontaneous vaginal delivery Postpartum Procedures: none Complications-Operative and Postpartum: PTD , fetal weight 2490 gm  HEMOGLOBIN  Date Value Ref Range Status  05/23/2015 11.4* 12.0 - 16.0 g/dL Final   HGB  Date Value Ref Range Status  06/30/2013 10.8* 12.0-16.0 g/dL Final   HCT  Date Value Ref Range Status  05/23/2015 32.7* 35.0 - 47.0 % Final  06/29/2013 32.6* 35.0-47.0 % Final    Physical Exam:  General: alert and cooperative Lochia: appropriate Uterine Fundus: firm Incision: n/a DVT Evaluation: No evidence of DVT seen on physical exam.  Discharge Diagnoses: PTD  Discharge Information: Date: 05/23/2015 Activity: unrestricted Diet: routine Medications: Ibuprofen Condition: stable Instructions: refer to practice specific booklet Discharge to: home Follow-up Information    Follow up with Dyke Weible, MD In 6 weeks.   Specialty:  Obstetrics and Gynecology   Contact information:   [address] [place] [address] [phone]       Newborn Data: Live born female  Birth Weight:   APGAR: ,   Home with mother.  Cindy Fox 05/23/2015, 8:21 AM

## 2015-05-23 NOTE — Progress Notes (Signed)
All discharge instructions given to patient and she voices understanding of all instructions given.  She will make her own 6 wks appt. Pt discharged home with spouse.

## 2015-05-23 NOTE — Anesthesia Postprocedure Evaluation (Signed)
  Anesthesia Post-op Note  Patient: Cindy Fox  Procedure(s) Performed: * No procedures listed *  Anesthesia type:Regional, Epidural  Patient location: 341  Post pain: Pain level controlled  Post assessment: Post-op Vital signs reviewed, Patient's Cardiovascular Status Stable, Respiratory Function Stable, Patent Airway and No signs of Nausea or vomiting  Post vital signs: Reviewed and stable  Last Vitals:  Filed Vitals:   05/23/15 0355  BP: 93/48  Pulse: 65  Temp: 37.2 C  Resp: 18    Level of consciousness: awake, alert  and patient cooperative  Complications: No apparent anesthesia complications  

## 2015-05-23 NOTE — Progress Notes (Signed)
CPR video showed and questions answered.  Dad is already certified in cpr he said.  Car seat passed

## 2015-05-23 NOTE — Anesthesia Postprocedure Evaluation (Deleted)
  Anesthesia Post-op Note  Patient: Cindy Fox  Procedure(s) Performed: * No procedures listed *  Anesthesia type:Regional, Epidural  Patient location: 341  Post pain: Pain level controlled  Post assessment: Post-op Vital signs reviewed, Patient's Cardiovascular Status Stable, Respiratory Function Stable, Patent Airway and No signs of Nausea or vomiting  Post vital signs: Reviewed and stable  Last Vitals:  Filed Vitals:   05/23/15 0355  BP: 93/48  Pulse: 65  Temp: 37.2 C  Resp: 18    Level of consciousness: awake, alert  and patient cooperative  Complications: No apparent anesthesia complications

## 2015-05-25 ENCOUNTER — Emergency Department: Payer: Self-pay

## 2015-05-25 ENCOUNTER — Encounter: Payer: Self-pay | Admitting: Emergency Medicine

## 2015-05-25 ENCOUNTER — Emergency Department
Admission: EM | Admit: 2015-05-25 | Discharge: 2015-05-25 | Disposition: A | Discharge status: Home / Self Care | Payer: Self-pay | Attending: Emergency Medicine | Admitting: Emergency Medicine

## 2015-05-25 DIAGNOSIS — Z791 Long term (current) use of non-steroidal anti-inflammatories (NSAID): Secondary | ICD-10-CM | POA: Insufficient documentation

## 2015-05-25 DIAGNOSIS — O9089 Other complications of the puerperium, not elsewhere classified: Secondary | ICD-10-CM | POA: Insufficient documentation

## 2015-05-25 DIAGNOSIS — R109 Unspecified abdominal pain: Secondary | ICD-10-CM | POA: Insufficient documentation

## 2015-05-25 DIAGNOSIS — R102 Pelvic and perineal pain: Secondary | ICD-10-CM | POA: Insufficient documentation

## 2015-05-25 DIAGNOSIS — Z79899 Other long term (current) drug therapy: Secondary | ICD-10-CM | POA: Insufficient documentation

## 2015-05-25 LAB — CBC WITH DIFFERENTIAL/PLATELET
BASOS ABS: 0 10*3/uL (ref 0–0.1)
Basophils Relative: 0 %
EOS PCT: 3 %
Eosinophils Absolute: 0.2 10*3/uL (ref 0–0.7)
HEMATOCRIT: 37.7 % (ref 35.0–47.0)
Hemoglobin: 12.4 g/dL (ref 12.0–16.0)
LYMPHS ABS: 2.9 10*3/uL (ref 1.0–3.6)
LYMPHS PCT: 32 %
MCH: 28.2 pg (ref 26.0–34.0)
MCHC: 33 g/dL (ref 32.0–36.0)
MCV: 85.7 fL (ref 80.0–100.0)
MONO ABS: 0.4 10*3/uL (ref 0.2–0.9)
MONOS PCT: 5 %
NEUTROS ABS: 5.5 10*3/uL (ref 1.4–6.5)
Neutrophils Relative %: 60 %
PLATELETS: 236 10*3/uL (ref 150–440)
RBC: 4.4 MIL/uL (ref 3.80–5.20)
RDW: 13.5 % (ref 11.5–14.5)
WBC: 9 10*3/uL (ref 3.6–11.0)

## 2015-05-25 LAB — URINALYSIS COMPLETE WITH MICROSCOPIC (ARMC ONLY)
BILIRUBIN URINE: NEGATIVE
Glucose, UA: NEGATIVE mg/dL
KETONES UR: NEGATIVE mg/dL
Leukocytes, UA: NEGATIVE
Nitrite: NEGATIVE
PROTEIN: NEGATIVE mg/dL
SPECIFIC GRAVITY, URINE: 1.005 (ref 1.005–1.030)
pH: 6 (ref 5.0–8.0)

## 2015-05-25 LAB — COMPREHENSIVE METABOLIC PANEL
ALK PHOS: 118 U/L (ref 38–126)
ALT: 15 U/L (ref 14–54)
ANION GAP: 6 (ref 5–15)
AST: 17 U/L (ref 15–41)
Albumin: 3.3 g/dL — ABNORMAL LOW (ref 3.5–5.0)
BILIRUBIN TOTAL: 0.7 mg/dL (ref 0.3–1.2)
BUN: 12 mg/dL (ref 6–20)
CALCIUM: 9 mg/dL (ref 8.9–10.3)
CO2: 29 mmol/L (ref 22–32)
Chloride: 103 mmol/L (ref 101–111)
Creatinine, Ser: 0.47 mg/dL (ref 0.44–1.00)
GFR calc Af Amer: >60 mL/min (ref >60)
Glucose, Bld: 74 mg/dL (ref 65–99)
POTASSIUM: 3.9 mmol/L (ref 3.5–5.1)
Sodium: 138 mmol/L (ref 135–145)
TOTAL PROTEIN: 7.3 g/dL (ref 6.5–8.1)

## 2015-05-25 MED ORDER — ACETAMINOPHEN 325 MG PO TABS
650.0000 mg | ORAL_TABLET | Freq: Once | ORAL | Status: AC
  Administered 2015-05-25: 650 mg via ORAL
  Filled 2015-05-25: qty 2

## 2015-05-25 MED ORDER — SODIUM CHLORIDE 0.9 % IV BOLUS (SEPSIS)
1000.0000 mL | Freq: Once | INTRAVENOUS | Status: AC
  Administered 2015-05-25: 1000 mL via INTRAVENOUS

## 2015-05-25 NOTE — ED Notes (Signed)
Pt reports delivered a baby 3 days ago, normal vaginal delivery. Pt reports she awoke this am with severe pain to her left lower abd. Last BM last pm. Interpreter present in room. Pain is intermittent.

## 2015-05-25 NOTE — ED Notes (Signed)
AAOx3.  Skin warm and dry. Ambulates with easy and steady gait. DC home 

## 2015-05-25 NOTE — ED Provider Notes (Signed)
[facility] Emergency Department Provider Note  ____________________________________________  Time seen: Approximately 1230PM  I have reviewed the triage vital signs and the nursing notes.   HISTORY  Chief Complaint Abdominal Pain    HPI Cindy Fox is a 24 y.o. female who is 3 days postpartum from a normal vaginal delivery who is presenting today with bilateral adnexal pain. She says the pain started at about 6 AM and is intermittent. It is not associated with any nausea, vomiting or diarrhea. She says she took 600 mg of ibuprofen at 10 AM with only mild improvement of the pain. She is having some mild vaginal bleeding but she says that the bleeding has been steadily decreasing from the time of her delivery. Her and her partner are concerned because the patient has had a history of ruptured ovarian cyst requiring laparoscopy in February.   Past Medical History  Diagnosis Date  . Medical history non-contributory     Patient Active Problem List   Diagnosis Date Noted  . Indication for care in labor or delivery 05/21/2015  . IUGR (intrauterine growth restriction) affecting care of mother 05/18/2015    Past Surgical History  Procedure Laterality Date  . Ovarian cyst removal  2015  . Cyst excision      Current Outpatient Rx  Name  Route  Sig  Dispense  Refill  . ibuprofen (ADVIL,MOTRIN) 600 MG tablet   Oral   Take 1 tablet (600 mg total) by mouth every 6 (six) hours.   60 tablet   0   . Prenatal Vit-Fe Fumarate-FA (PRENATAL MULTIVITAMIN) TABS tablet   Oral   Take 1 tablet by mouth daily at 12 noon.           Allergies Review of patient's allergies indicates no known allergies.  No family history on file.  Social History Social History  Substance Use Topics  . Smoking status: Never Smoker   . Smokeless tobacco: Not on file  . Alcohol Use: No    Review of Systems Constitutional: No fever/chills Eyes: No visual changes. ENT: No  sore throat. Cardiovascular: Denies chest pain. Respiratory: Denies shortness of breath. Gastrointestinal:  No nausea, no vomiting.  No diarrhea.  No constipation. Genitourinary: Negative for dysuria. Musculoskeletal: Negative for back pain. Skin: Negative for rash. Neurological: Negative for headaches, focal weakness or numbness.  10-point ROS otherwise negative.  ____________________________________________   PHYSICAL EXAM:  VITAL SIGNS: ED Triage Vitals  Enc Vitals Group     BP 05/25/15 1055 101/61 mmHg     Pulse Rate 05/25/15 1055 69     Resp 05/25/15 1055 20     Temp 05/25/15 1055 97.5 F (36.4 C)     Temp Source 05/25/15 1055 Oral     SpO2 05/25/15 1055 99 %     Weight 05/25/15 1055 120 lb (54.432 kg)     Height 05/25/15 1055 5' (1.524 m)     Head Cir --      Peak Flow --      Pain Score 05/25/15 1056 8     Pain Loc --      Pain Edu? --      Excl. in GC? --     Constitutional: Alert and oriented. Well appearing and in no acute distress. Eyes: Conjunctivae are normal. PERRL. EOMI. Head: Atraumatic. Nose: No congestion/rhinnorhea. Mouth/Throat: Mucous membranes are moist.  Oropharynx non-erythematous. Neck: No stridor.   Cardiovascular: Normal rate, regular rhythm. Grossly normal heart sounds.  Good peripheral  circulation. Respiratory: Normal respiratory effort.  No retractions. Lungs CTAB. Gastrointestinal: Soft with mild tenderness across the lower abdomen. There is no rebound or guarding. No distention. No abdominal bruits. No CVA tenderness. Musculoskeletal: No lower extremity tenderness nor edema.  No joint effusions. Neurologic:  Normal speech and language. No gross focal neurologic deficits are appreciated. No gait instability. Skin:  Skin is warm, dry and intact. No rash noted. Psychiatric: Mood and affect are normal. Speech and behavior are normal.  ____________________________________________   LABS (all labs ordered are listed, but only abnormal  results are displayed)  Labs Reviewed  COMPREHENSIVE METABOLIC PANEL - Abnormal; Notable for the following:    Albumin 3.3 (*)    All other components within normal limits  URINALYSIS COMPLETEWITH MICROSCOPIC ([facility] ONLY) - Abnormal; Notable for the following:    Color, Urine STRAW (*)    APPearance CLEAR (*)    Hgb urine dipstick 2+ (*)    Bacteria, UA RARE (*)    Squamous Epithelial / LPF 0-5 (*)    All other components within normal limits  CBC WITH DIFFERENTIAL/PLATELET  CBC   ____________________________________________  EKG   ____________________________________________  RADIOLOGY  No acute pathology on the ultrasound of the pelvis. ____________________________________________   PROCEDURES  ____________________________________________   INITIAL IMPRESSION / ASSESSMENT AND PLAN / ED COURSE  Pertinent labs & imaging results that were available during my care of the patient were reviewed by me and considered in my medical decision making (see chart for details).  ----------------------------------------- 4:45 PM on 05/25/2015 -----------------------------------------  Patient resting comfortably at this time. No acute pathology on the ultrasound of her pelvis. Using the interpreter, auto, I discussed the lab as well as imaging results with the patient and her partner. We discussed that this is most likely the result of a uterine contraction down to a smaller size since giving birth. They'll follow up as planned with the health department. ____________________________________________   FINAL CLINICAL IMPRESSION(S) / ED DIAGNOSES  Acute postpartum pelvic pain.    Myrna Blazer, MD 05/25/15 [phone]

## 2015-05-28 ENCOUNTER — Inpatient Hospital Stay: Admission: RE | Admit: 2015-05-28 | Payer: Self-pay | Source: Ambulatory Visit

## 2016-09-25 IMAGING — US US PELVIS COMPLETE
1 series · 14 of 25 positions shown · non-contrast
Comparison: None.

CLINICAL DATA: Left adnexal pain 3 days postpartum.

EXAM:
TRANSABDOMINAL ULTRASOUND OF PELVIS
DOPPLER ULTRASOUND OF OVARIES
TECHNIQUE: Transabdominal ultrasound examination of the pelvis was performed
including evaluation of the uterus, ovaries, adnexal regions, and
pelvic cul-de-sac.
Color and duplex Doppler ultrasound was utilized to evaluate blood
flow to the ovaries.

[Series 1: us pelvis complete · 0.21mm/px · 14 of 61 slices shown]
[im 1/61]
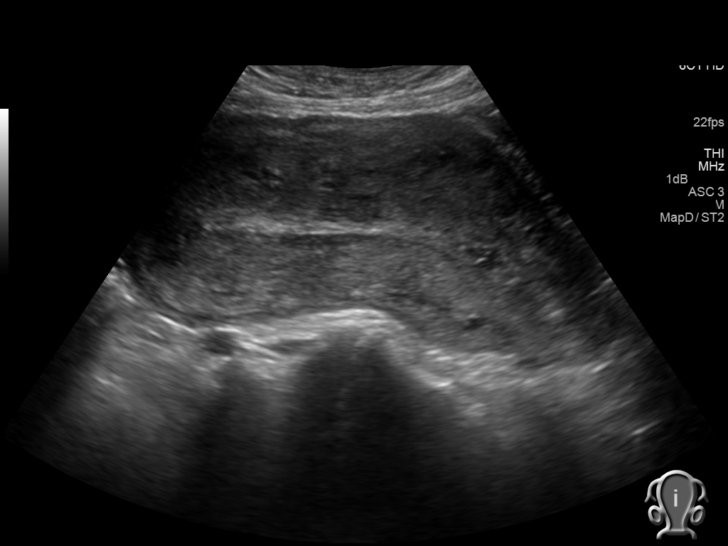
[im 6/61]
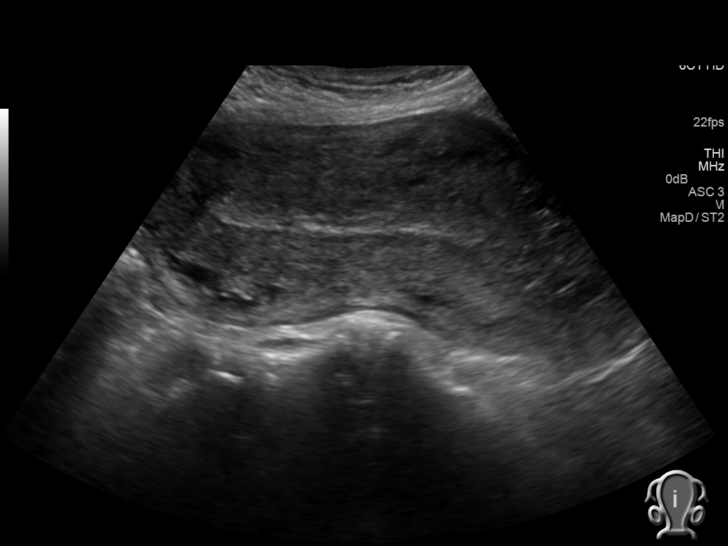
[im 11/61]
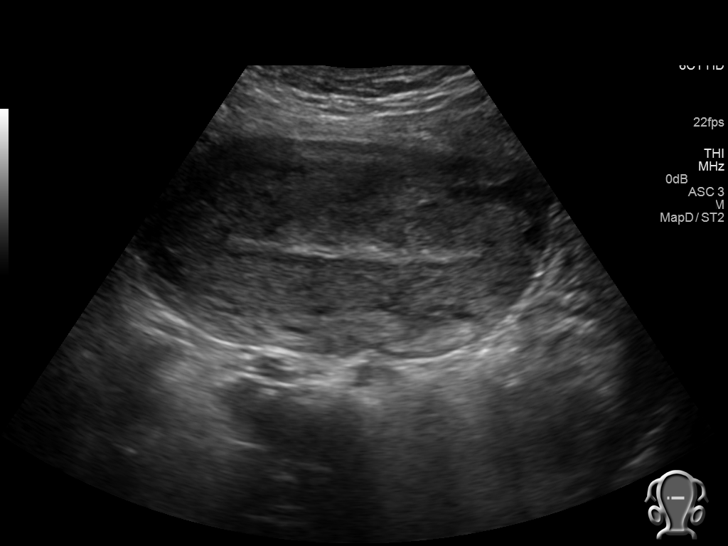
[im 16/61]
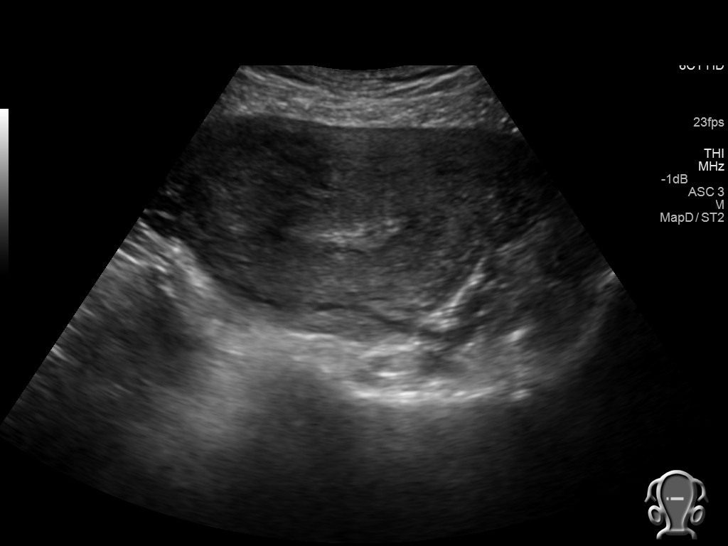
[im 21/61]
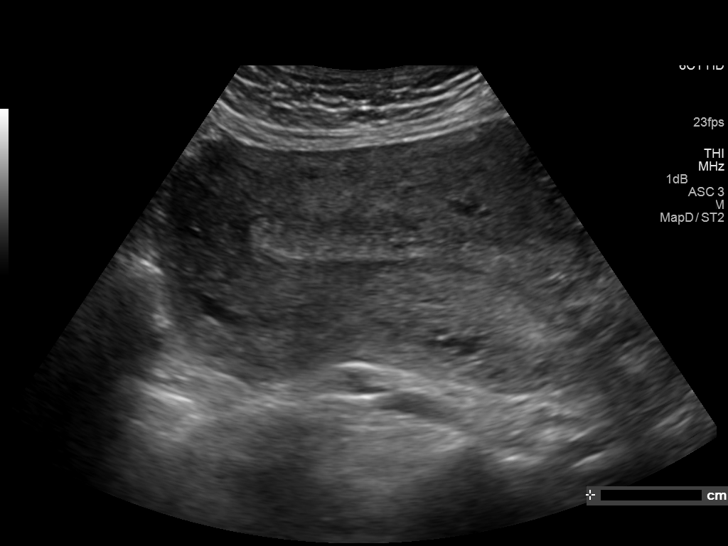
[im 23/61]
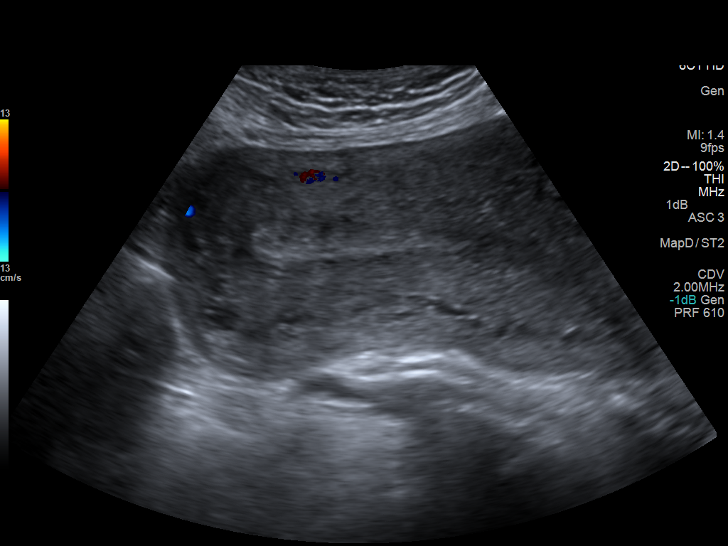
[im 28/61]
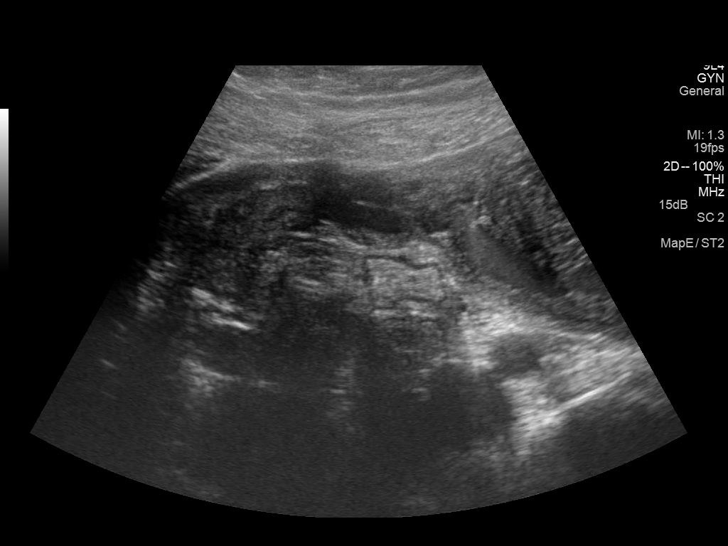
[im 33/61]
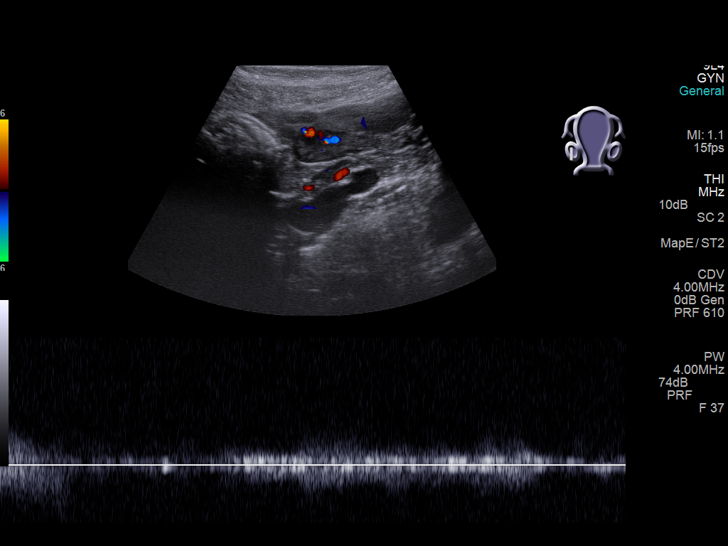
[im 38/61]
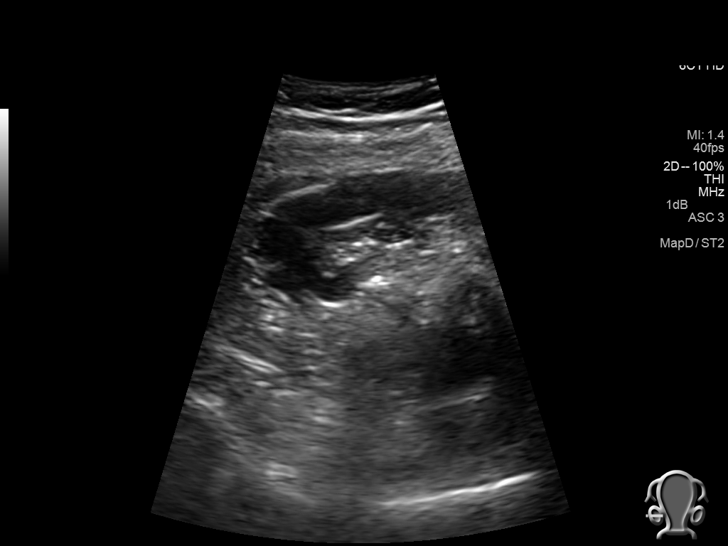
[im 41/61]
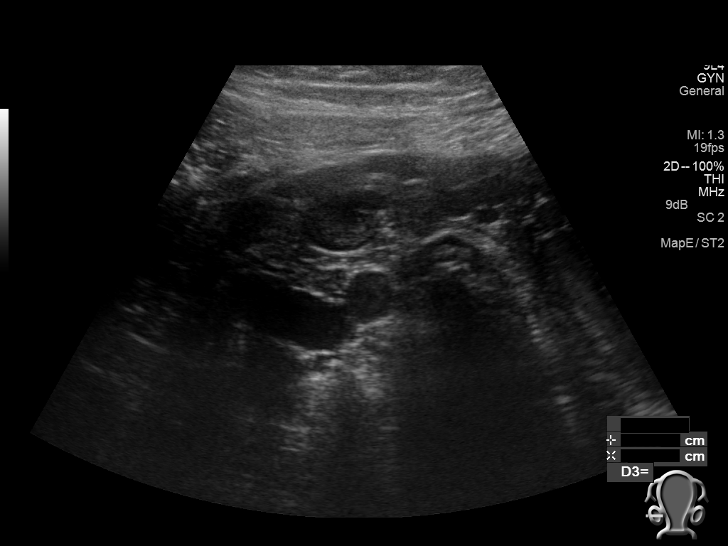
[im 46/61]
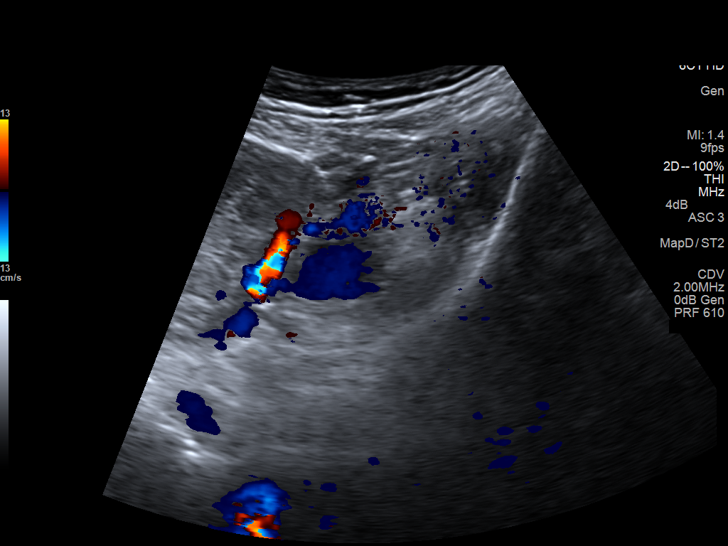
[im 51/61]
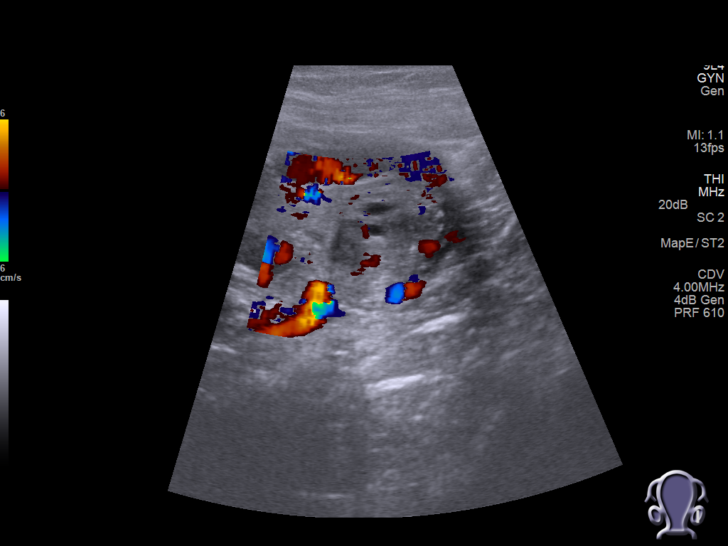
[im 56/61]
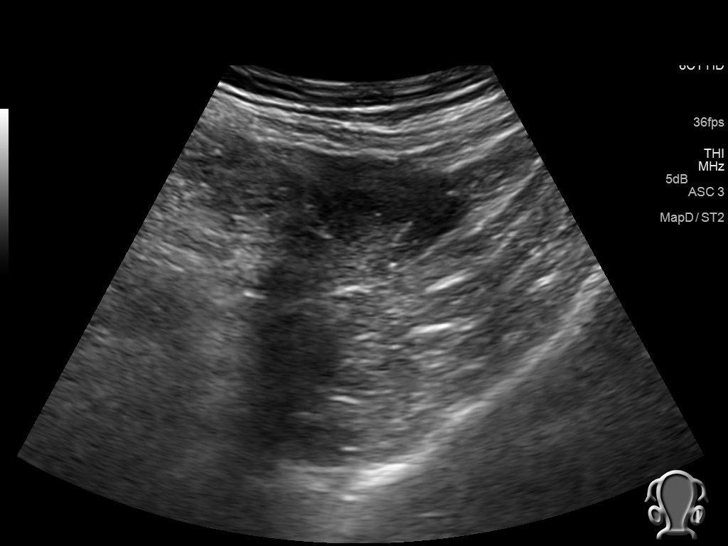
[im 61/61]
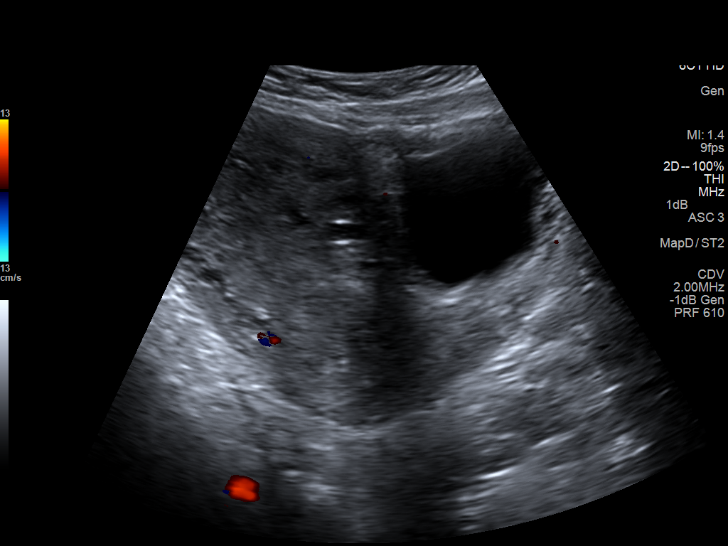

[14 of 25 positions shown; findings below may reference images not displayed]

FINDINGS: Uterus

Measurements: Uterus is somewhat heterogeneous, as expected
following recent delivery. Size is within normal limits following
recent delivery at 14.2 x 6.0 x 11.5 cm.. No fibroids or other mass
visualized.

Endometrium

Thickness: 11.3 mm, within normal limits. No focal abnormality
visualized.

Right ovary

Measurements: 1.6 x 1.0 x 2.2 cm. Normal appearance/no adnexal mass.

Left ovary

Measurements: 1.9 x 1.4 x 1.9 cm. Normal appearance/no adnexal mass.

Pulsed Doppler evaluation demonstrates normal low-resistance
arterial and venous waveforms in both ovaries.
IMPRESSION: 1. Normal postpartum appearance of the uterus.
2. The ovaries are within normal limits bilaterally. Normal color
Doppler flow and waveforms are present.

## 2016-12-07 ENCOUNTER — Emergency Department
Admission: EM | Admit: 2016-12-07 | Discharge: 2016-12-07 | Disposition: A | Discharge status: Home / Self Care | Payer: Medicaid Other | Attending: Emergency Medicine | Admitting: Emergency Medicine

## 2016-12-07 ENCOUNTER — Emergency Department: Payer: Medicaid Other

## 2016-12-07 DIAGNOSIS — Y929 Unspecified place or not applicable: Secondary | ICD-10-CM | POA: Insufficient documentation

## 2016-12-07 DIAGNOSIS — S52602A Unspecified fracture of lower end of left ulna, initial encounter for closed fracture: Secondary | ICD-10-CM | POA: Insufficient documentation

## 2016-12-07 DIAGNOSIS — W1839XA Other fall on same level, initial encounter: Secondary | ICD-10-CM | POA: Insufficient documentation

## 2016-12-07 DIAGNOSIS — S62102A Fracture of unspecified carpal bone, left wrist, initial encounter for closed fracture: Secondary | ICD-10-CM

## 2016-12-07 DIAGNOSIS — S52502A Unspecified fracture of the lower end of left radius, initial encounter for closed fracture: Secondary | ICD-10-CM | POA: Insufficient documentation

## 2016-12-07 DIAGNOSIS — Y9389 Activity, other specified: Secondary | ICD-10-CM | POA: Insufficient documentation

## 2016-12-07 DIAGNOSIS — N9489 Other specified conditions associated with female genital organs and menstrual cycle: Secondary | ICD-10-CM | POA: Insufficient documentation

## 2016-12-07 DIAGNOSIS — Y998 Other external cause status: Secondary | ICD-10-CM | POA: Insufficient documentation

## 2016-12-07 LAB — HCG, QUANTITATIVE, PREGNANCY: hCG, Beta Chain, Quant, S: <1 m[IU]/mL (ref <5)

## 2016-12-07 MED ORDER — OXYCODONE-ACETAMINOPHEN 5-325 MG PO TABS
1.0000 | ORAL_TABLET | ORAL | 0 refills | Status: DC | PRN
Start: 2016-12-07 — End: 2017-03-30

## 2016-12-07 MED ORDER — OXYCODONE-ACETAMINOPHEN 5-325 MG PO TABS
1.0000 | ORAL_TABLET | Freq: Once | ORAL | Status: AC
  Administered 2016-12-07: 1 via ORAL
  Filled 2016-12-07: qty 1

## 2016-12-07 MED ORDER — ONDANSETRON HCL 4 MG/2ML IJ SOLN
INTRAMUSCULAR | Status: AC
  Filled 2016-12-07: qty 2

## 2016-12-07 MED ORDER — HYDROMORPHONE HCL 1 MG/ML IJ SOLN
1.0000 mg | Freq: Once | INTRAMUSCULAR | Status: AC
  Administered 2016-12-07: 1 mg via INTRAVENOUS

## 2016-12-07 MED ORDER — HYDROMORPHONE HCL 1 MG/ML IJ SOLN
1.0000 mg | Freq: Once | INTRAMUSCULAR | Status: AC
  Administered 2016-12-07: 1 mg via INTRAVENOUS
  Filled 2016-12-07: qty 1

## 2016-12-07 MED ORDER — ONDANSETRON HCL 4 MG/2ML IJ SOLN
4.0000 mg | Freq: Once | INTRAMUSCULAR | Status: AC
  Administered 2016-12-07: 4 mg via INTRAVENOUS

## 2016-12-07 MED ORDER — SODIUM CHLORIDE 0.9 % IV BOLUS (SEPSIS)
1000.0000 mL | Freq: Once | INTRAVENOUS | Status: AC
  Administered 2016-12-07: 1000 mL via INTRAVENOUS

## 2016-12-07 MED ORDER — FENTANYL CITRATE (PF) 100 MCG/2ML IJ SOLN
100.0000 ug | Freq: Once | INTRAMUSCULAR | Status: AC
  Administered 2016-12-07: 100 ug via INTRAVENOUS
  Filled 2016-12-07: qty 2

## 2016-12-07 MED ORDER — HYDROMORPHONE HCL 1 MG/ML IJ SOLN
INTRAMUSCULAR | Status: AC
  Filled 2016-12-07: qty 1

## 2016-12-07 NOTE — ED Notes (Signed)
Tech at bedside for splint.

## 2016-12-07 NOTE — ED Notes (Signed)
Cindy Fox, EDT came and alerted me that patient had left darker hand them her right.  Patient has had cast applied.  She is non-rousable unless you speak loudly in her face, to which she will respond.

## 2016-12-07 NOTE — ED Triage Notes (Signed)
Pt fell this am at 0900, pt has an obvious deformity with swelling to the left wrist

## 2016-12-07 NOTE — Discharge Instructions (Signed)
Please seek medical attention for any high fevers, chest pain, shortness of breath, change in behavior, persistent vomiting, bloody stool or any other new or concerning symptoms.  

## 2016-12-07 NOTE — ED Notes (Signed)
Patient is now speaking and carrying on conversation with [place], [place]

## 2016-12-07 NOTE — ED Notes (Addendum)
Patient's dc was reversed d/t patient turning grey in the lobby.  She was brought back to room 17 and placed on monitoring.  O2 sats were in mid 80's and face mask applied @ 15L.  This RN changed O2 sat probe and sats went to 100%, then patient placed on 2L nasal cannula.  MD was made aware.

## 2016-12-07 NOTE — ED Notes (Signed)
Remains to have left radial pulse. Sensation intact . Slight increase in swelling. Ice applied. Dr Derrill Kay aware.

## 2016-12-07 NOTE — ED Notes (Signed)
MD at bedside with medical interpreter

## 2016-12-07 NOTE — ED Notes (Signed)
Understood discharge teaching for splint care.

## 2016-12-07 NOTE — ED Notes (Signed)
Pt unable to obtain urine specimen.

## 2016-12-07 NOTE — ED Provider Notes (Signed)
[facility] Emergency Department Provider Note  ____________________________________________   I have reviewed the triage vital signs and the nursing notes.   HISTORY  Chief Complaint Wrist Pain   History limited by: Language [facility] Interpreter utilized   HPI Cindy Fox is a 26 y.o. female who presents to the emergency department today because of concerns for left wrist pain. She states that she fell on her wrist roughly 7 hours ago. The wrist was in flexion when she fell on it. She was playing with her kids at the time. Since that time she has had increased swelling and pain in that wrist. She feels some numbness to her third fourth and fifth digits. Denies any other injury.   Past Medical History:  Diagnosis Date  . Medical history non-contributory     Patient Active Problem List   Diagnosis Date Noted  . Indication for care in labor or delivery 05/21/2015  . 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 CYST EXCISION    . OVARIAN CYST REMOVAL  2015    Prior to Admission medications   Medication Sig Start Date End Date Taking? Authorizing Provider  ibuprofen (ADVIL,MOTRIN) 600 MG tablet Take 1 tablet (600 mg total) by mouth every 6 (six) hours. Patient not taking: Reported on 12/07/2016 05/23/15   Ihor Austin Schermerhorn, MD  Prenatal Vit-Fe Fumarate-FA (PRENATAL MULTIVITAMIN) TABS tablet Take 1 tablet by mouth daily at 12 noon.    Historical Provider, MD    Allergies Patient has no known allergies.  No family history on file.  Social History Social History  Substance Use Topics  . Smoking status: Never Smoker  . Smokeless tobacco: Not on file  . Alcohol use No    Review of Systems  Constitutional: Negative for fever. Cardiovascular: Negative for chest pain. Respiratory: Negative for shortness of breath. Gastrointestinal: Negative  for abdominal pain, vomiting and diarrhea. Neurological: Negative for headaches, focal weakness or numbness.  10-point ROS otherwise negative.  ____________________________________________   PHYSICAL EXAM:  VITAL SIGNS: ED Triage Vitals  Enc Vitals Group     BP 12/07/16 1549 94/65     Pulse Rate 12/07/16 1549 92     Resp --      Temp 12/07/16 1549 98.9 F (37.2 C)     Temp Source 12/07/16 1549 Oral     SpO2 12/07/16 1549 99 %     Weight 12/07/16 1550 127 lb (57.6 kg)     Height 12/07/16 1550  (1.575 m)     Head Circumference --      Peak Flow --      Pain Score 12/07/16 1549 10     Pain Loc --      Pain Edu? --      Excl. in GC? --      Constitutional: Alert and oriented. Well appearing and in no distress. Eyes: Conjunctivae are normal. Normal extraocular movements. ENT   Head: Normocephalic and atraumatic.   Nose: No congestion/rhinnorhea.   Mouth/Throat: Mucous membranes are moist.   Neck: No stridor. Hematological/Lymphatic/Immunilogical: No cervical lymphadenopathy. Cardiovascular: Normal rate, regular rhythm.  No murmurs, rubs, or gallops.  Respiratory: Normal respiratory effort without tachypnea nor retractions. Breath sounds are clear and equal bilaterally. No wheezes/rales/rhonchi. Gastrointestinal: Soft and non tender. No rebound. No guarding.  Genitourinary: Deferred Musculoskeletal: Left wrist with swelling and bruising. Ulnar and radial pulse 2+. Cap refill wnl.  Sensation intact over finger pad for 1st and 2nd digit. Patient states no sensation over 3,4,5th finger pads.  Neurologic:  Normal speech and language. No gross focal neurologic deficits are appreciated.  Skin:  Skin is warm, dry and intact. No rash noted. Psychiatric: Mood and affect are normal. Speech and behavior are normal. Patient exhibits appropriate insight and judgment.  ____________________________________________    LABS (pertinent positives/negatives)  Labs Reviewed   HCG, QUANTITATIVE, PREGNANCY     ____________________________________________   EKG  None  ____________________________________________    RADIOLOGY  Left wrist IMPRESSION:  Distal radius and ulna fractures, as detailed above.   ____________________________________________   PROCEDURES  Procedures  ____________________________________________   INITIAL IMPRESSION / ASSESSMENT AND PLAN / ED COURSE  Pertinent labs & imaging results that were available during my care of the patient were reviewed by me and considered in my medical decision making (see chart for details).  Patient presented to the emergency department today because of concerns for swelling to her left wrist after a fall. On exam there is significant swelling to that wrist. X-rays do show a distal radial ulnar fracture. Discussed with Dr. Hyacinth Meeker who reviewed the images and recommended splinting and outpatient follow-up. After splint was applied patient continued to have good cap refill. Patient was discharged with pain medication.   As the patient was being wheeled out to the waiting room it sounds like the patient had a vasovagal episode. She was returned to the room and IV fluids were started. She did have good cap refill in her hand.  ____________________________________________   FINAL CLINICAL IMPRESSION(S) / ED DIAGNOSES  Final diagnoses:  Closed fracture of left wrist, initial encounter     Note: This dictation was prepared with Dragon dictation. Any transcriptional errors that result from this process are unintentional     Phineas Semen, MD 12/08/16 1401

## 2016-12-07 NOTE — ED Notes (Signed)
Patient is AOx4 at dc time.  She says she still has 10/10 pain but feels a lot better.

## 2017-03-30 ENCOUNTER — Encounter: Payer: Self-pay | Admitting: Emergency Medicine

## 2017-03-30 ENCOUNTER — Emergency Department: Payer: Self-pay

## 2017-03-30 ENCOUNTER — Emergency Department
Admission: EM | Admit: 2017-03-30 | Discharge: 2017-03-30 | Disposition: A | Discharge status: Home / Self Care | Payer: Self-pay | Attending: Emergency Medicine | Admitting: Emergency Medicine

## 2017-03-30 DIAGNOSIS — R109 Unspecified abdominal pain: Secondary | ICD-10-CM | POA: Insufficient documentation

## 2017-03-30 DIAGNOSIS — B9689 Other specified bacterial agents as the cause of diseases classified elsewhere: Secondary | ICD-10-CM

## 2017-03-30 DIAGNOSIS — N760 Acute vaginitis: Secondary | ICD-10-CM | POA: Insufficient documentation

## 2017-03-30 LAB — COMPREHENSIVE METABOLIC PANEL
ALK PHOS: 83 U/L (ref 38–126)
ALT: 13 U/L — ABNORMAL LOW (ref 14–54)
ANION GAP: 7 (ref 5–15)
AST: 18 U/L (ref 15–41)
Albumin: 3.9 g/dL (ref 3.5–5.0)
BILIRUBIN TOTAL: 0.7 mg/dL (ref 0.3–1.2)
BUN: 19 mg/dL (ref 6–20)
CALCIUM: 9.2 mg/dL (ref 8.9–10.3)
CO2: 27 mmol/L (ref 22–32)
Chloride: 102 mmol/L (ref 101–111)
Creatinine, Ser: 0.69 mg/dL (ref 0.44–1.00)
Glucose, Bld: 101 mg/dL — ABNORMAL HIGH (ref 65–99)
Potassium: 3.7 mmol/L (ref 3.5–5.1)
SODIUM: 136 mmol/L (ref 135–145)
Total Protein: 7.3 g/dL (ref 6.5–8.1)

## 2017-03-30 LAB — CHLAMYDIA/NGC RT PCR (ARMC ONLY)
Chlamydia Tr: NOT DETECTED
N gonorrhoeae: NOT DETECTED

## 2017-03-30 LAB — CBC
HEMATOCRIT: 35.7 % (ref 35.0–47.0)
HEMOGLOBIN: 12.1 g/dL (ref 12.0–16.0)
MCH: 28.2 pg (ref 26.0–34.0)
MCHC: 33.9 g/dL (ref 32.0–36.0)
MCV: 83.2 fL (ref 80.0–100.0)
Platelets: 291 10*3/uL (ref 150–440)
RBC: 4.29 MIL/uL (ref 3.80–5.20)
RDW: 13.9 % (ref 11.5–14.5)
WBC: 13 10*3/uL — ABNORMAL HIGH (ref 3.6–11.0)

## 2017-03-30 LAB — WET PREP, GENITAL
Sperm: NONE SEEN
TRICH WET PREP: NONE SEEN
YEAST WET PREP: NONE SEEN

## 2017-03-30 LAB — URINALYSIS, ROUTINE W REFLEX MICROSCOPIC
Bilirubin Urine: NEGATIVE
Glucose, UA: NEGATIVE mg/dL
Hgb urine dipstick: NEGATIVE
KETONES UR: NEGATIVE mg/dL
LEUKOCYTES UA: NEGATIVE
NITRITE: NEGATIVE
PROTEIN: NEGATIVE mg/dL
Specific Gravity, Urine: 1.027 (ref 1.005–1.030)
pH: 6 (ref 5.0–8.0)

## 2017-03-30 LAB — POCT PREGNANCY, URINE: Preg Test, Ur: NEGATIVE

## 2017-03-30 MED ORDER — TRAMADOL HCL 50 MG PO TABS
50.0000 mg | ORAL_TABLET | Freq: Once | ORAL | Status: AC
  Administered 2017-03-30: 50 mg via ORAL
  Filled 2017-03-30: qty 1

## 2017-03-30 MED ORDER — METRONIDAZOLE 500 MG PO TABS: 500.0000 mg | ORAL_TABLET | Freq: Two times a day (BID) | ORAL | 0 refills | Status: AC

## 2017-03-30 MED ORDER — METRONIDAZOLE 500 MG PO TABS
500.0000 mg | ORAL_TABLET | Freq: Once | ORAL | Status: AC
  Administered 2017-03-30: 500 mg via ORAL
  Filled 2017-03-30: qty 1

## 2017-03-30 MED ORDER — TRAMADOL HCL 50 MG PO TABS: 50.0000 mg | ORAL_TABLET | Freq: Four times a day (QID) | ORAL | 0 refills | Status: DC | PRN

## 2017-03-30 MED ORDER — KETOROLAC TROMETHAMINE 30 MG/ML IJ SOLN
30.0000 mg | Freq: Once | INTRAMUSCULAR | Status: AC
  Administered 2017-03-30: 30 mg via INTRAVENOUS
  Filled 2017-03-30: qty 1

## 2017-03-30 NOTE — ED Triage Notes (Signed)
Pt reports dysuria x 2 days; denies frequency or hematuria; c/o low midline abd pain; denies fever;

## 2017-03-30 NOTE — ED Notes (Signed)
Pt to US.

## 2017-03-30 NOTE — ED Provider Notes (Signed)
[facility] Emergency Department Provider Note   ____________________________________________   First MD Initiated Contact with Patient 03/30/17 [phone]     (approximate)  I have reviewed the triage vital signs and the nursing notes.   HISTORY  Chief Complaint Dysuria    HPI Cindy Fox is a 26 y.o. female who comes into the hospital today with some lower abdominal pain. The patient reports it started yesterday. She denies any pain with urination. Her last measured pain was 2 weeks ago. She reports it was not normal. The patient has a 70-year-old home. She denies any vaginal pain or discharge. She reports that the pain is all across her lower abdomen. The patient had a cyst on her ovary in the past which she had to have removed and she reports that this feels very similar. The patient has no nausea or vomiting no diarrhea or fevers. She rates her pain a 6 out of 10 in intensity but she didn't take anything for pain at home. The patient is here today for evaluation of her symptoms.   Past Medical History:  Diagnosis Date  . Medical history non-contributory     Patient Active Problem List   Diagnosis Date Noted  . Indication for care in labor or delivery 05/21/2015  . 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 CYST EXCISION    . OVARIAN CYST REMOVAL  2015    Prior to Admission medications   Medication Sig Start Date End Date Taking? Authorizing Provider  metroNIDAZOLE (FLAGYL) 500 MG tablet Take 1 tablet (500 mg total) by mouth 2 (two) times daily. 03/30/17 04/06/17  Rebecka Apley, MD  traMADol (ULTRAM) 50 MG tablet Take 1 tablet (50 mg total) by mouth every 6 (six) hours as needed. 03/30/17   Rebecka Apley, MD    Allergies Patient has no known allergies.  History reviewed. No pertinent family history.  Social History Social History  Substance Use  Topics  . Smoking status: Never Smoker  . Smokeless tobacco: Never Used  . Alcohol use No    Review of Systems  Constitutional: No fever/chills Eyes: No visual changes. ENT: No sore throat. Cardiovascular: Denies chest pain. Respiratory: Denies shortness of breath. Gastrointestinal:  abdominal pain.  No nausea, no vomiting.  No diarrhea.  No constipation. Genitourinary: Negative for dysuria. Musculoskeletal: Negative for back pain. Skin: Negative for rash. Neurological: Negative for headaches, focal weakness or numbness.   ____________________________________________   PHYSICAL EXAM:  VITAL SIGNS: ED Triage Vitals  Enc Vitals Group     BP 03/30/17 0141 114/75     Pulse Rate 03/30/17 0141 74     Resp 03/30/17 0141 18     Temp 03/30/17 0141 97.8 F (36.6 C)     Temp Source 03/30/17 0141 Oral     SpO2 03/30/17 0141 100 %     Weight 03/30/17 0142 133 lb (60.3 kg)     Height --      Head Circumference --      Peak Flow --      Pain Score 03/30/17 0140 6     Pain Loc --      Pain Edu? --      Excl. in GC? --     Constitutional: Alert and oriented. Well appearing and in Mild distress. Eyes: Conjunctivae are normal. PERRL. EOMI. Head: Atraumatic. Nose: No congestion/rhinnorhea. Mouth/Throat: Mucous membranes are moist.  Oropharynx non-erythematous. Cardiovascular: Normal rate, regular rhythm. Grossly normal heart sounds.  Good peripheral circulation. Respiratory: Normal respiratory effort.  No retractions. Lungs CTAB. Gastrointestinal: Soft with some lower abdominal tenderness to palpation. No distention. Positive bowel sounds Genitourinary: Normal external genitalia some mild discharge in the vaginal vault, no cervical motion tenderness, bilateral adnexal and uterine tenderness to palpation. Musculoskeletal: No lower extremity tenderness nor edema.  Neurologic:  Normal speech and language.  Skin:  Skin is warm, dry and intact.  Psychiatric: Mood and affect are  normal.   ____________________________________________   LABS (all labs ordered are listed, but only abnormal results are displayed)  Labs Reviewed  WET PREP, GENITAL - Abnormal; Notable for the following:       Result Value   Clue Cells Wet Prep HPF POC PRESENT (*)    WBC, Wet Prep HPF POC FEW (*)    All other components within normal limits  URINALYSIS, ROUTINE W REFLEX MICROSCOPIC - Abnormal; Notable for the following:    Color, Urine YELLOW (*)    APPearance CLEAR (*)    All other components within normal limits  CBC - Abnormal; Notable for the following:    WBC 13.0 (*)    All other components within normal limits  COMPREHENSIVE METABOLIC PANEL - Abnormal; Notable for the following:    Glucose, Bld 101 (*)    ALT 13 (*)    All other components within normal limits  CHLAMYDIA/NGC RT PCR ([facility] ONLY)  POC URINE PREG, ED  POCT PREGNANCY, URINE   ____________________________________________  EKG  none ____________________________________________  RADIOLOGY  US Transvaginal Non-ob  Result Date: 03/30/2017 CLINICAL DATA:  Initial evaluation for acute midline pelvic pain for 2 days. EXAM: TRANSABDOMINAL AND TRANSVAGINAL ULTRASOUND OF PELVIS DOPPLER ULTRASOUND OF OVARIES TECHNIQUE: Both transabdominal and transvaginal ultrasound examinations of the pelvis were performed. Transabdominal technique was performed for global imaging of the pelvis including uterus, ovaries, adnexal regions, and pelvic cul-de-sac. It was necessary to proceed with endovaginal exam following the transabdominal exam to visualize the uterus and ovaries. Color and duplex Doppler ultrasound was utilized to evaluate blood flow to the ovaries. COMPARISON:  Prior ultrasound from 05/21/2015. FINDINGS: Uterus Measurements: 8.1 x 4.1 x 5.5 cm. No fibroids or other mass visualized. Endometrium Thickness: 9 mm.  No focal abnormality visualized. Right ovary Measurements: 3.8 x 1.9 x 2.5 cm. Normal appearance/no  adnexal mass. Left ovary Measurements: 3.2 x 1.7 x 2.4 cm. Normal appearance/no adnexal mass. Pulsed Doppler evaluation of both ovaries demonstrates normal low-resistance arterial and venous waveforms. Other findings No abnormal free fluid. IMPRESSION: Normal pelvic ultrasound. No acute abnormality identified. No evidence for torsion. Electronically Signed   By: Rise Mu M.D.   On: 03/30/2017 05:34   US Pelvis Complete  Result Date: 03/30/2017 CLINICAL DATA:  Initial evaluation for acute midline pelvic pain for 2 days. EXAM: TRANSABDOMINAL AND TRANSVAGINAL ULTRASOUND OF PELVIS DOPPLER ULTRASOUND OF OVARIES TECHNIQUE: Both transabdominal and transvaginal ultrasound examinations of the pelvis were performed. Transabdominal technique was performed for global imaging of the pelvis including uterus, ovaries, adnexal regions, and pelvic cul-de-sac. It was necessary to proceed with endovaginal exam following the transabdominal exam to visualize the uterus and ovaries. Color and duplex Doppler ultrasound was utilized to evaluate blood flow to the ovaries. COMPARISON:  Prior ultrasound from 05/21/2015. FINDINGS: Uterus Measurements: 8.1 x 4.1 x 5.5 cm. No fibroids or other mass visualized. Endometrium Thickness: 9 mm.  No focal abnormality visualized. Right ovary Measurements: 3.8 x 1.9 x  2.5 cm. Normal appearance/no adnexal mass. Left ovary Measurements: 3.2 x 1.7 x 2.4 cm. Normal appearance/no adnexal mass. Pulsed Doppler evaluation of both ovaries demonstrates normal low-resistance arterial and venous waveforms. Other findings No abnormal free fluid. IMPRESSION: Normal pelvic ultrasound. No acute abnormality identified. No evidence for torsion. Electronically Signed   By: Rise MuBenjamin  McClintock M.D.   On: 03/30/2017 05:34   [place] Art/ven Flow Abd Pelv Doppler  Result Date: 03/30/2017 CLINICAL DATA:  Initial evaluation for acute midline pelvic pain for 2 days. EXAM: TRANSABDOMINAL AND TRANSVAGINAL ULTRASOUND  OF PELVIS DOPPLER ULTRASOUND OF OVARIES TECHNIQUE: Both transabdominal and transvaginal ultrasound examinations of the pelvis were performed. Transabdominal technique was performed for global imaging of the pelvis including uterus, ovaries, adnexal regions, and pelvic cul-de-sac. It was necessary to proceed with endovaginal exam following the transabdominal exam to visualize the uterus and ovaries. Color and duplex Doppler ultrasound was utilized to evaluate blood flow to the ovaries. COMPARISON:  Prior ultrasound from 05/21/2015. FINDINGS: Uterus Measurements: 8.1 x 4.1 x 5.5 cm. No fibroids or other mass visualized. Endometrium Thickness: 9 mm.  No focal abnormality visualized. Right ovary Measurements: 3.8 x 1.9 x 2.5 cm. Normal appearance/no adnexal mass. Left ovary Measurements: 3.2 x 1.7 x 2.4 cm. Normal appearance/no adnexal mass. Pulsed Doppler evaluation of both ovaries demonstrates normal low-resistance arterial and venous waveforms. Other findings No abnormal free fluid. IMPRESSION: Normal pelvic ultrasound. No acute abnormality identified. No evidence for torsion. Electronically Signed   By: Rise MuBenjamin  McClintock M.D.   On: 03/30/2017 05:34    ____________________________________________   PROCEDURES  Procedure(s) performed: None  Procedures  Critical Care performed: No  ____________________________________________   INITIAL IMPRESSION / ASSESSMENT AND PLAN / ED COURSE  Pertinent labs & imaging results that were available during my care of the patient were reviewed by me and considered in my medical decision making (see chart for details).  This is a 26 year old female who comes into the hospital today with some lower abdominal pain. She denies any dysuria. I did a pelvic exam and sent a wet prep and culture for the patient. Also sent the patient for an ultrasound given her history of a cyst. The patient's ultrasound is negative and doesn't show any cyst or any acute pathology. The  patient's wet prep though did return with bacterial vaginosis. I will give the patient a dose of metronidazole and she will be discharged home to follow-up with OB or with the health department. The patient has no further questions or concerns.      ____________________________________________   FINAL CLINICAL IMPRESSION(S) / ED DIAGNOSES  Final diagnoses:  Abdominal pain  Bacterial vaginitis      NEW MEDICATIONS STARTED DURING THIS VISIT:  New Prescriptions   METRONIDAZOLE (FLAGYL) 500 MG TABLET    Take 1 tablet (500 mg total) by mouth 2 (two) times daily.   TRAMADOL (ULTRAM) 50 MG TABLET    Take 1 tablet (50 mg total) by mouth every 6 (six) hours as needed.     Note:  This document was prepared using Dragon voice recognition software and may include unintentional dictation errors.    Rebecka ApleyWebster, Dannis Deroche P, MD 03/30/17 [phone]

## 2017-08-26 ENCOUNTER — Emergency Department
Admission: EM | Admit: 2017-08-26 | Discharge: 2017-08-26 | Disposition: A | Discharge status: Home / Self Care | Payer: Self-pay | Attending: Emergency Medicine | Admitting: Emergency Medicine

## 2017-08-26 ENCOUNTER — Other Ambulatory Visit: Payer: Self-pay

## 2017-08-26 DIAGNOSIS — Y999 Unspecified external cause status: Secondary | ICD-10-CM | POA: Insufficient documentation

## 2017-08-26 DIAGNOSIS — Y939 Activity, unspecified: Secondary | ICD-10-CM | POA: Insufficient documentation

## 2017-08-26 DIAGNOSIS — S025XXB Fracture of tooth (traumatic), initial encounter for open fracture: Secondary | ICD-10-CM

## 2017-08-26 DIAGNOSIS — X58XXXA Exposure to other specified factors, initial encounter: Secondary | ICD-10-CM | POA: Insufficient documentation

## 2017-08-26 DIAGNOSIS — Y929 Unspecified place or not applicable: Secondary | ICD-10-CM | POA: Insufficient documentation

## 2017-08-26 DIAGNOSIS — S025XXA Fracture of tooth (traumatic), initial encounter for closed fracture: Secondary | ICD-10-CM | POA: Insufficient documentation

## 2017-08-26 MED ORDER — LIDOCAINE VISCOUS 2 % MT SOLN: 10.0000 mL | OROMUCOSAL | 0 refills | Status: DC | PRN

## 2017-08-26 MED ORDER — AMOXICILLIN 500 MG PO CAPS
500.0000 mg | ORAL_CAPSULE | Freq: Once | ORAL | Status: AC
  Administered 2017-08-26: 500 mg via ORAL
  Filled 2017-08-26: qty 1

## 2017-08-26 MED ORDER — OXYCODONE-ACETAMINOPHEN 5-325 MG PO TABS
1.0000 | ORAL_TABLET | Freq: Once | ORAL | Status: AC
  Administered 2017-08-26: 1 via ORAL
  Filled 2017-08-26: qty 1

## 2017-08-26 MED ORDER — AMOXICILLIN 500 MG PO CAPS: 500.0000 mg | ORAL_CAPSULE | Freq: Three times a day (TID) | ORAL | 0 refills | Status: DC

## 2017-08-26 MED ORDER — IBUPROFEN 600 MG PO TABS: 600.0000 mg | ORAL_TABLET | Freq: Four times a day (QID) | ORAL | 0 refills | Status: DC | PRN

## 2017-08-26 MED ORDER — LIDOCAINE VISCOUS 2 % MT SOLN
15.0000 mL | Freq: Once | OROMUCOSAL | Status: AC
  Administered 2017-08-26: 15 mL via OROMUCOSAL
  Filled 2017-08-26: qty 15

## 2017-08-26 NOTE — ED Triage Notes (Signed)
Pt in with co toothache x 2 days

## 2017-08-26 NOTE — ED Provider Notes (Signed)
[facility] Emergency Department Provider Note  ____________________________________________  Time seen: Approximately 11:56 PM  I have reviewed the triage vital signs and the nursing notes.   HISTORY  Chief Complaint Dental Pain    HPI Cindy Fox is a 26 y.o. female that presents to the emergency department for evaluation of left lower tooth pain for 2 days.  No injury.  Patient does not remember the last time she saw a dentist.  She has not checked her temperature but states that face feels warm.  No swelling, shortness of breath, nausea, vomiting.  Past Medical History:  Diagnosis Date  . Medical history non-contributory     Patient Active Problem List   Diagnosis Date Noted  . Indication for care in labor or delivery 05/21/2015  . 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 CYST EXCISION    . OVARIAN CYST REMOVAL  2015    Prior to Admission medications   Medication Sig Start Date End Date Taking? Authorizing Provider  amoxicillin (AMOXIL) 500 MG capsule Take 1 capsule (500 mg total) by mouth 3 (three) times daily. 08/26/17   Enid Derry, PA-C  ibuprofen (ADVIL,MOTRIN) 600 MG tablet Take 1 tablet (600 mg total) by mouth every 6 (six) hours as needed. 08/26/17   Enid Derry, PA-C  lidocaine (XYLOCAINE) 2 % solution Use as directed 10 mLs in the mouth or throat as needed for mouth pain. 08/26/17   Enid Derry, PA-C  traMADol (ULTRAM) 50 MG tablet Take 1 tablet (50 mg total) by mouth every 6 (six) hours as needed. 03/30/17   Rebecka Apley, MD    Allergies Patient has no known allergies.  No family history on file.  Social History Social History   Tobacco Use  . Smoking status: Never Smoker  . Smokeless tobacco: Never Used  Substance Use Topics  . Alcohol use: No  . Drug use: No     Review of Systems  Cardiovascular: No chest  pain. Respiratory: No SOB. Gastrointestinal: No abdominal pain.  No nausea, no vomiting.  Skin: Negative for rash, abrasions, lacerations, ecchymosis. Neurological: Negative for headaches, numbness or tingling   ____________________________________________   PHYSICAL EXAM:  VITAL SIGNS: ED Triage Vitals  Enc Vitals Group     BP 08/26/17 2200 107/60     Pulse Rate 08/26/17 2200 84     Resp 08/26/17 2200 17     Temp 08/26/17 2200 98.5 F (36.9 C)     Temp Source 08/26/17 2200 Oral     SpO2 08/26/17 2200 100 %     Weight 08/26/17 2158 138 lb (62.6 kg)     Height --      Head Circumference --      Peak Flow --      Pain Score 08/26/17 2158 9     Pain Loc --      Pain Edu? --      Excl. in GC? --      Constitutional: Alert and oriented. Well appearing and in no acute distress. Eyes: Conjunctivae are normal. PERRL. EOMI. Head: Atraumatic. ENT:      Ears:      Nose: No congestion/rhinnorhea.      Mouth/Throat: Mucous membranes are moist.  Large fracture in the low left back molar.  No visible swelling.  No drainage from mouth. Neck: No stridor. Cardiovascular: Normal rate, regular rhythm.  Good peripheral circulation. Respiratory: Normal  respiratory effort without tachypnea or retractions. Good air entry to the bases with no decreased or absent breath sounds. Musculoskeletal: Full range of motion to all extremities. No gross deformities appreciated. Neurologic:  Normal speech and language. No gross focal neurologic deficits are appreciated.  Skin:  Skin is warm, dry and intact. No rash noted.   ____________________________________________   LABS (all labs ordered are listed, but only abnormal results are displayed)  Labs Reviewed - No data to display ____________________________________________  EKG   ____________________________________________  RADIOLOGY  No results found.  ____________________________________________    PROCEDURES  Procedure(s)  performed:    Procedures    Medications  amoxicillin (AMOXIL) capsule 500 mg (500 mg Oral Given 08/26/17 2348)  oxyCODONE-acetaminophen (PERCOCET/ROXICET) 5-325 MG per tablet 1 tablet (1 tablet Oral Given 08/26/17 2348)  lidocaine (XYLOCAINE) 2 % viscous mouth solution 15 mL (15 mLs Mouth/Throat Given 08/26/17 2348)     ____________________________________________   INITIAL IMPRESSION / ASSESSMENT AND PLAN / ED COURSE  Pertinent labs & imaging results that were available during my care of the patient were reviewed by me and considered in my medical decision making (see chart for details).  Review of the  [place] was performed in accordance of the [facility] prior to dispensing any controlled drugs.   Patient's diagnosis is consistent with dental fracture.  Vital signs and exam are reassuring.  Patient was given a dose of amoxicillin, Percocet, viscous lidocaine in ED.  Dental resources were provided.  Patient will be discharged home with prescriptions for amoxicillin and viscous lidocaine. Patient is to follow up with dentist as directed. Patient is given ED precautions to return to the ED for any worsening or new symptoms.     ____________________________________________  FINAL CLINICAL IMPRESSION(S) / ED DIAGNOSES  Final diagnoses:  Open fracture of tooth, initial encounter      NEW MEDICATIONS STARTED DURING THIS VISIT:  ED Discharge Orders        Ordered    amoxicillin (AMOXIL) 500 MG capsule  3 times daily     08/26/17 2341    lidocaine (XYLOCAINE) 2 % solution  As needed     08/26/17 2341    ibuprofen (ADVIL,MOTRIN) 600 MG tablet  Every 6 hours PRN     08/26/17 2341          This chart was dictated using voice recognition software/Dragon. Despite best efforts to proofread, errors can occur which can change the meaning. Any change was purely unintentional.    Enid DerryWagner, Felisha Claytor, PA-C 08/26/17 2359    Minna AntisPaduchowski, Kevin, MD 08/27/17 [phone]

## 2017-08-26 NOTE — Discharge Instructions (Signed)
OPTIONS FOR DENTAL FOLLOW UP CARE ° °[place] Department of Health and Human Services - Local Safety Net Dental Clinics °http://www.ncdhhs.gov/dph/oralhealth/services/safetynetclinics.htm °  °[facility] (336-562-3123) ° °[facility] [place] (919-933-9087) ° °[facility] ([phone] ext [phone]) ° °[facility] ([phone]) ° °[facility] ([phone]) °This clinic caters to the indigent population and is on a lottery system. °Location: °[facility], [address], [place] °Clinic Hours: °Wednesdays from 6pm - 9pm, patients seen by a lottery system. °For dates, call or go to [email or web address]/[facility]/patients/[email or web address] °Services: °Cleanings, fillings and simple extractions. °Payment Options: °DENTAL WORK IS FREE OF CHARGE. Bring proof of income or support. °Best way to get seen: °Arrive at 5:15 pm - this is a lottery, NOT first come/first serve, so arriving earlier will not increase your chances of being seen. °  °  °[facility] °[phone] °Select option 1 for emergencies °  °Location: °[facility], [address], [place] °Clinic Hours: °No walk-ins accepted - call the day before to schedule an appointment. °Check in times are 9:30 am and 1:30 pm. °Services: °Simple extractions, temporary fillings, pulpectomy/pulp debridement, uncomplicated abscess drainage. °Payment Options: °PAYMENT IS DUE AT THE TIME OF SERVICE.  Fee is usually $100-200, additional surgical procedures (e.g. abscess drainage) may be extra. °Cash, checks, Visa/MasterCard accepted.  Can file Medicaid if patient is covered for dental - patient should call case worker to check. °No discount for [facility] patients. °Best way to get seen: °MUST call the day before and get onto the schedule. Can usually be seen the next 1-2 days. No walk-ins accepted. °  °  °[facility] °[phone] °   °Location: °[facility], [address], [place] °Clinic Hours: °M, W, Th, F 8am or 1:30pm, Tues 9a or 1:30 - first come/first served. °Services: °Simple extractions, temporary fillings, uncomplicated abscess drainage.  You do not need to be an [place] resident. °Payment Options: °PAYMENT IS DUE AT THE TIME OF SERVICE. °Dental insurance, otherwise sliding scale - bring proof of income or support. °Depending on income and treatment needed, cost is usually $50-200. °Best way to get seen: °Arrive early as it is first come/first served. °  °  °[facility] °[phone] °  °Location: °[address] °Clinic Hours: °Mon-Thu 8a-5p °Services: °Most basic dental services including extractions and fillings. °Payment Options: °PAYMENT IS DUE AT THE TIME OF SERVICE. °Sliding scale, up to 50% off - bring proof if income or support. °Medicaid with dental option accepted. °Best way to get seen: °Call to schedule an appointment, can usually be seen within 2 weeks OR they will try to see walk-ins - show up at 8a or 2p (you may have to wait). °  °  °[facility] °[phone] °[place] RESIDENTS ONLY °  °Location: °[facility], [address], [place], [place] [address] °Clinic Hours: By appointment only. °Monday - Thursday 8am-5pm, Friday 8am-12pm °Services: Cleanings, fillings, extractions. °Payment Options: °PAYMENT IS DUE AT THE TIME OF SERVICE. °Cash, Visa or MasterCard. Sliding scale - $30 minimum per service. °Best way to get seen: °Come in to office, complete packet and make an appointment - need proof of income °or support monies for each household member and proof of [place] residence. °Usually takes about a month to get in. °  °  °[facility] °[phone] °  °Location: °[address]   [place] °Clinic Hours: Walk-in Urgent Care Dental Services are offered Monday-Friday  mornings only. °The numbers of emergencies accepted daily is limited to the number of °providers available. °Maximum 15 - Mondays, Wednesdays & Thursdays °Maximum 10 - Tuesdays & Fridays °Services: °You do not need to be a [place] resident to be seen for a dental emergency. °Emergencies are defined as pain, swelling, abnormal bleeding, or dental trauma. Walkins will receive x-rays if needed. °NOTE: Dental cleaning is not an emergency. °Payment Options: °PAYMENT IS DUE AT THE TIME OF SERVICE. °Minimum co-pay is $40.00 for uninsured patients. °Minimum co-pay is $3.00 for Medicaid with dental coverage. °Dental Insurance is accepted and must be presented at time of visit. °Medicare does not cover dental. °Forms of payment: Cash, credit card, checks. °Best way to get seen: °If not previously registered with the clinic, walk-in dental registration begins at 7:15 am and is on a first come/first serve basis. °If previously registered with the clinic, call to make an appointment. °  °  °[facility] °[phone] °[place] RESIDENTS ONLY °  °Location: °[address], [place], [place] °Clinic Hours: °Mon-Thu 10a-2p °Services: Extractions only! °Payment Options: °FREE (donations accepted) - bring proof of income or support °Best way to get seen: °Call and schedule an appointment OR come at 8am on the 1st Monday of every month (except for holidays) when it is first come/first served. °  °  °[facility] °[phone] °  °Location: °[address], [place] °Clinic Hours: °Friday mornings °Services, Payment Options, Best way to get seen: °Call for info °

## 2017-09-08 ENCOUNTER — Emergency Department: Payer: Self-pay

## 2017-09-08 ENCOUNTER — Encounter: Payer: Self-pay | Admitting: Emergency Medicine

## 2017-09-08 ENCOUNTER — Emergency Department
Admission: EM | Admit: 2017-09-08 | Discharge: 2017-09-08 | Disposition: A | Discharge status: Home / Self Care | Payer: Self-pay | Attending: Emergency Medicine | Admitting: Emergency Medicine

## 2017-09-08 ENCOUNTER — Other Ambulatory Visit: Payer: Self-pay

## 2017-09-08 DIAGNOSIS — W1789XA Other fall from one level to another, initial encounter: Secondary | ICD-10-CM | POA: Insufficient documentation

## 2017-09-08 DIAGNOSIS — Y929 Unspecified place or not applicable: Secondary | ICD-10-CM | POA: Insufficient documentation

## 2017-09-08 DIAGNOSIS — Y990 Civilian activity done for income or pay: Secondary | ICD-10-CM | POA: Insufficient documentation

## 2017-09-08 DIAGNOSIS — Y9389 Activity, other specified: Secondary | ICD-10-CM | POA: Insufficient documentation

## 2017-09-08 DIAGNOSIS — M545 Low back pain, unspecified: Secondary | ICD-10-CM

## 2017-09-08 DIAGNOSIS — S060X0A Concussion without loss of consciousness, initial encounter: Secondary | ICD-10-CM | POA: Insufficient documentation

## 2017-09-08 LAB — POCT PREGNANCY, URINE: PREG TEST UR: NEGATIVE

## 2017-09-08 MED ORDER — IBUPROFEN 600 MG PO TABS
600.0000 mg | ORAL_TABLET | Freq: Once | ORAL | Status: AC
  Administered 2017-09-08: 600 mg via ORAL
  Filled 2017-09-08: qty 1

## 2017-09-08 NOTE — ED Notes (Signed)
Urine sent to lab Lm edt  

## 2017-09-08 NOTE — ED Triage Notes (Addendum)
Patient ambulatory to triage with steady gait, without difficulty or distress noted; per ipad interpreter, pt reports that she fell at work while "trying to climb up on something" (employeed at [employer] in [place] but denies desire to file workers comp at present) hitting back of head on floor; denies LOC or dizziness but c/o occipital HA x 2 hrs; denies any other c/o or injury

## 2017-09-08 NOTE — ED Provider Notes (Signed)
[facility] Emergency Department Provider Note  ____________________________________________   First MD Initiated Contact with Patient 09/08/17 [phone]     (approximate)  I have reviewed the triage vital signs and the nursing notes.   HISTORY  Chief Complaint Headache   HPI Cindy Fox is a 27 y.o. female who is presenting to the emergency department after fall from work.  She says that she was trying reach something high up on a shelf when she got up on a platform about 1 foot tall and fell backward, hitting the back left of her head.  She denies loss of consciousness but says that she felt mildly confused afterward.  Says that she feels dizzy now but is no longer confused.  Denies any vomiting.  Says the pain is an 8 out of 10 at this time.  Does not report any visual changes.  Is not on any blood thinners.  Denies any pain to her neck.  However, she is also having pain to her low, midline lumbar region.  Not reporting any loss of bowel or bladder continence.  Not reporting any numbness or weakness the bilateral lower extremities.  Nothing makes the headache better or worse.  Low back pain is worsened with movements.   Past Medical History:  Diagnosis Date  . Medical history non-contributory     Patient Active Problem List   Diagnosis Date Noted  . Indication for care in labor or delivery 05/21/2015  . 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 CYST EXCISION    . OVARIAN CYST REMOVAL  2015    Prior to Admission medications   Medication Sig Start Date End Date Taking? Authorizing Provider  amoxicillin (AMOXIL) 500 MG capsule Take 1 capsule (500 mg total) by mouth 3 (three) times daily. 08/26/17   Enid DerryWagner, Ashley, PA-C  ibuprofen (ADVIL,MOTRIN) 600 MG tablet Take 1 tablet (600 mg total) by mouth every 6 (six) hours as needed. 08/26/17   Enid DerryWagner, Ashley, PA-C    lidocaine (XYLOCAINE) 2 % solution Use as directed 10 mLs in the mouth or throat as needed for mouth pain. 08/26/17   Enid DerryWagner, Ashley, PA-C  traMADol (ULTRAM) 50 MG tablet Take 1 tablet (50 mg total) by mouth every 6 (six) hours as needed. Patient not taking: Reported on 09/08/2017 03/30/17   Rebecka ApleyWebster, Allison P, MD    Allergies Patient has no known allergies.  No family history on file.  Social History Social History   Tobacco Use  . Smoking status: Never Smoker  . Smokeless tobacco: Never Used  Substance Use Topics  . Alcohol use: No  . Drug use: No    Review of Systems  Constitutional: No fever/chills Eyes: No visual changes. ENT: No sore throat. Cardiovascular: Denies chest pain. Respiratory: Denies shortness of breath. Gastrointestinal: No abdominal pain.  No nausea, no vomiting.  No diarrhea.  No constipation. Genitourinary: Negative for dysuria. Musculoskeletal: As above Skin: Negative for rash. Neurological: Negative for focal weakness or numbness.   ____________________________________________   PHYSICAL EXAM:  VITAL SIGNS: ED Triage Vitals  Enc Vitals Group     BP 09/08/17 0636 (!) 131/92     Pulse Rate 09/08/17 0636 81     Resp 09/08/17 0636 18     Temp 09/08/17 0636 (!) 97.5 F (36.4 C)     Temp Source 09/08/17 0636 Oral     SpO2 09/08/17 0636 100 %  Weight 09/08/17 0644 138 lb (62.6 kg)     Height --      Head Circumference --      Peak Flow --      Pain Score 09/08/17 0644 8     Pain Loc --      Pain Edu? --      Excl. in GC? --     Constitutional: Alert and oriented. Well appearing and in no acute distress. Eyes: Conjunctivae are normal.  Head: Small hematoma to the left parietal region without any bogginess or depression. Nose: No congestion/rhinnorhea. Mouth/Throat: Mucous membranes are moist.  Neck: No stridor.  No tenderness to palpation to the midline cervical spine. Cardiovascular: Normal rate, regular rhythm. Grossly normal heart  sounds.   Respiratory: Normal respiratory effort.  No retractions. Lungs CTAB. Gastrointestinal: Soft and nontender. No distention. No CVA tenderness. Musculoskeletal: No lower extremity tenderness nor edema.  No joint effusions. Tenderness to palpation to L3-L4 midline without any deformity or step-off.  No ecchymosis.  No lateralizing to palpation.  5 out of 5 strength bilateral lower extreme is without any saddle anesthesia. Neurologic:  Normal speech and language. No gross focal neurologic deficits are appreciated. Skin:  Skin is warm, dry and intact. No rash noted. Psychiatric: Mood and affect are normal. Speech and behavior are normal.  ____________________________________________   LABS (all labs ordered are listed, but only abnormal results are displayed)  Labs Reviewed - No data to display ____________________________________________  EKG   ____________________________________________  RADIOLOGY  Radiographs of the lumbar spine are negative for acute pathology. ____________________________________________   PROCEDURES  Procedure(s) performed:   Procedures  Critical Care performed:   ____________________________________________   INITIAL IMPRESSION / ASSESSMENT AND PLAN / ED COURSE  Pertinent labs & imaging results that were available during my care of the patient were reviewed by me and considered in my medical decision making (see chart for details).  DDX: Concussion, intrarenal hemorrhage, skull fracture, hematoma, contusion, low back pain, herniated disc, vertebral fracture As part of my medical decision making, I reviewed the following data within the electronic MEDICAL RECORD NUMBER Notes from prior ED visits  Canadian c spine negative.  Interpreter, Claretha Cooper, present for interpretation.     ----------------------------------------- 1:22 PM on 09/08/2017 -----------------------------------------  Patient at this time resting without any distress.  Says that  the headache has improved with ibuprofen.  Reassuring radiographs.  Likely concussion with back pain secondary to injury.  Patient will be discharged at this time.  She is understanding of the plan willing to comply but will give her outpatient follow-up.  To return for any worsening or concerning symptoms.  Interpreter used for all interactions.  Continues to be with a nonfocal neurologic exam.  Very low suspicion for intracranial hemorrhage or serious intracranial injury requiring intervention.  ____________________________________________   FINAL CLINICAL IMPRESSION(S) / ED DIAGNOSES  Concussion.  Low back pain.    NEW MEDICATIONS STARTED DURING THIS VISIT:  New Prescriptions   No medications on file     Note:  This document was prepared using Dragon voice recognition software and may include unintentional dictation errors.     Myrna Blazer, MD 09/08/17 1323

## 2017-09-08 NOTE — ED Notes (Signed)
ED Provider at bedside. 

## 2017-09-08 NOTE — ED Notes (Signed)
Pt denies weakness, numbness, or tingling in arms or legs

## 2017-09-08 NOTE — ED Notes (Signed)
POC NEGATIVE  LMEDT

## 2017-09-09 ENCOUNTER — Emergency Department: Payer: Self-pay

## 2017-09-09 ENCOUNTER — Other Ambulatory Visit: Payer: Self-pay

## 2017-09-09 ENCOUNTER — Emergency Department
Admission: EM | Admit: 2017-09-09 | Discharge: 2017-09-09 | Disposition: A | Discharge status: Home / Self Care | Payer: Self-pay | Attending: Emergency Medicine | Admitting: Emergency Medicine

## 2017-09-09 DIAGNOSIS — M545 Low back pain, unspecified: Secondary | ICD-10-CM

## 2017-09-09 DIAGNOSIS — M549 Dorsalgia, unspecified: Secondary | ICD-10-CM | POA: Insufficient documentation

## 2017-09-09 DIAGNOSIS — M542 Cervicalgia: Secondary | ICD-10-CM | POA: Insufficient documentation

## 2017-09-09 DIAGNOSIS — Y929 Unspecified place or not applicable: Secondary | ICD-10-CM | POA: Insufficient documentation

## 2017-09-09 DIAGNOSIS — Z79899 Other long term (current) drug therapy: Secondary | ICD-10-CM | POA: Insufficient documentation

## 2017-09-09 DIAGNOSIS — S060X0A Concussion without loss of consciousness, initial encounter: Secondary | ICD-10-CM | POA: Insufficient documentation

## 2017-09-09 DIAGNOSIS — Y999 Unspecified external cause status: Secondary | ICD-10-CM | POA: Insufficient documentation

## 2017-09-09 DIAGNOSIS — R0789 Other chest pain: Secondary | ICD-10-CM | POA: Insufficient documentation

## 2017-09-09 DIAGNOSIS — W19XXXA Unspecified fall, initial encounter: Secondary | ICD-10-CM | POA: Insufficient documentation

## 2017-09-09 DIAGNOSIS — S161XXA Strain of muscle, fascia and tendon at neck level, initial encounter: Secondary | ICD-10-CM

## 2017-09-09 DIAGNOSIS — R51 Headache: Secondary | ICD-10-CM | POA: Insufficient documentation

## 2017-09-09 DIAGNOSIS — Y939 Activity, unspecified: Secondary | ICD-10-CM | POA: Insufficient documentation

## 2017-09-09 LAB — CBC
HCT: 36.8 % (ref 35.0–47.0)
HEMOGLOBIN: 12.1 g/dL (ref 12.0–16.0)
MCH: 26.9 pg (ref 26.0–34.0)
MCHC: 32.8 g/dL (ref 32.0–36.0)
MCV: 82.1 fL (ref 80.0–100.0)
Platelets: 290 10*3/uL (ref 150–440)
RBC: 4.49 MIL/uL (ref 3.80–5.20)
RDW: 13.5 % (ref 11.5–14.5)
WBC: 8 10*3/uL (ref 3.6–11.0)

## 2017-09-09 LAB — BASIC METABOLIC PANEL
ANION GAP: 8 (ref 5–15)
BUN: 14 mg/dL (ref 6–20)
CHLORIDE: 105 mmol/L (ref 101–111)
CO2: 26 mmol/L (ref 22–32)
Calcium: 8.9 mg/dL (ref 8.9–10.3)
Creatinine, Ser: 0.45 mg/dL (ref 0.44–1.00)
GFR calc Af Amer: >60 mL/min (ref >60)
GLUCOSE: 120 mg/dL — AB (ref 65–99)
POTASSIUM: 3.2 mmol/L — AB (ref 3.5–5.1)
Sodium: 139 mmol/L (ref 135–145)

## 2017-09-09 LAB — TROPONIN I: Troponin I: <0.03 ng/mL (ref <0.03)

## 2017-09-09 MED ORDER — OXYCODONE-ACETAMINOPHEN 5-325 MG PO TABS
1.0000 | ORAL_TABLET | Freq: Once | ORAL | Status: AC
  Administered 2017-09-09: 1 via ORAL
  Filled 2017-09-09: qty 1

## 2017-09-09 MED ORDER — CARISOPRODOL 350 MG PO TABS: 350.0000 mg | ORAL_TABLET | Freq: Three times a day (TID) | ORAL | 0 refills | Status: AC | PRN

## 2017-09-09 NOTE — ED Triage Notes (Addendum)
Pt to triage with chest pain for 2 days.  Pain in center of chest.  Vomiting x 2 today. Pt alert.  Speech clear.   Pt was seen in the er yesterday with a fall and continues to be sore all over from the fall.  Interpreter in with pt.

## 2017-09-09 NOTE — ED Provider Notes (Signed)
[facility] Emergency Department Provider Note  ____________________________________________   First MD Initiated Contact with Patient 09/09/17 2057     (approximate)  I have reviewed the triage vital signs and the nursing notes.   HISTORY  Chief Complaint Chest Pain   HPI Cindy Fox is a 27 y.o. female who was evaluated yesterday after a fall who is presenting to the emergency department today with worsening head and neck pain as well as 2 episodes of vomiting.  She says that her pain is moderate and posterior to the head as well as to the neck which she says feels stiff and is difficult to move.  She says that she is also having cramping body aches to the low back that have worsened since yesterday as well as upper chest pain that hurts with movement.  She says that she has been taking ibuprofen without relief.   Past Medical History:  Diagnosis Date  . Medical history non-contributory     Patient Active Problem List   Diagnosis Date Noted  . Indication for care in labor or delivery 05/21/2015  . 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 CYST EXCISION    . OVARIAN CYST REMOVAL  2015    Prior to Admission medications   Medication Sig Start Date End Date Taking? Authorizing Provider  amoxicillin (AMOXIL) 500 MG capsule Take 1 capsule (500 mg total) by mouth 3 (three) times daily. 08/26/17   Enid Derry, PA-C  ibuprofen (ADVIL,MOTRIN) 600 MG tablet Take 1 tablet (600 mg total) by mouth every 6 (six) hours as needed. 08/26/17   Enid Derry, PA-C  lidocaine (XYLOCAINE) 2 % solution Use as directed 10 mLs in the mouth or throat as needed for mouth pain. 08/26/17   Enid Derry, PA-C  traMADol (ULTRAM) 50 MG tablet Take 1 tablet (50 mg total) by mouth every 6 (six) hours as needed. Patient not taking: Reported on 09/08/2017 03/30/17   Rebecka Apley, MD     Allergies Patient has no known allergies.  No family history on file.  Social History Social History   Tobacco Use  . Smoking status: Never Smoker  . Smokeless tobacco: Never Used  Substance Use Topics  . Alcohol use: No  . Drug use: No    Review of Systems  Constitutional: No fever/chills Eyes: No visual changes. ENT: No sore throat. Cardiovascular: As above  Respiratory: Denies shortness of breath. Gastrointestinal: No abdominal pain.  No nausea, no vomiting.  No diarrhea.  No constipation. Genitourinary: Negative for dysuria. Musculoskeletal: As above Skin: Negative for rash. Neurological: Negative for focal weakness or numbness.   ____________________________________________   PHYSICAL EXAM:  VITAL SIGNS: ED Triage Vitals  Enc Vitals Group     BP 09/09/17 1802 132/64     Pulse Rate 09/09/17 1802 82     Resp 09/09/17 1802 20     Temp 09/09/17 1802 98.1 F (36.7 C)     Temp Source 09/09/17 1802 Oral     SpO2 09/09/17 1802 100 %     Weight 09/09/17 1803 138 lb (62.6 kg)     Height 09/09/17 1803 5\' 3"  (1.6 m)     Head Circumference --      Peak Flow --      Pain Score 09/09/17 1801 8     Pain Loc --      Pain Edu? --  Excl. in GC? --     Constitutional: Alert and oriented. Well appearing and in no acute distress. Eyes: Conjunctivae are normal.  Head: Atraumatic.  Mild tenderness to palpation over the occiput. Nose: No congestion/rhinnorhea. Mouth/Throat: Mucous membranes are moist.  Neck: No stridor.  Tenderness to palpation diffusely to the posterior C-spine without deformity or step-off.  Patient seems to be uncomfortable ranging her neck secondary to the pain. Cardiovascular: Normal rate, regular rhythm. Grossly normal heart sounds.  Reproducible tenderness palpation over the superior aspect of the sternum without any ecchymosis, deformity or crepitus. Respiratory: Normal respiratory effort.  No retractions. Lungs CTAB. Gastrointestinal:  Soft and nontender. No distention. No CVA tenderness. Musculoskeletal: No lower extremity tenderness nor edema.  No joint effusions.  Moderate tenderness diffusely over the lumbar region.  No deformity or step-off. Neurologic:  Normal speech and language. No gross focal neurologic deficits are appreciated. Skin:  Skin is warm, dry and intact. No rash noted. Psychiatric: Mood and affect are normal. Speech and behavior are normal.  ____________________________________________   LABS (all labs ordered are listed, but only abnormal results are displayed)  Labs Reviewed  BASIC METABOLIC PANEL - Abnormal; Notable for the following components:      Result Value   Potassium 3.2 (*)    Glucose, Bld 120 (*)    All other components within normal limits  CBC  TROPONIN I   ____________________________________________  EKG  ED ECG REPORT I, Arelia Longest, the attending physician, personally viewed and interpreted this ECG.   Date: 09/09/2017  EKG Time: 1802  Rate: 80  Rhythm: normal sinus rhythm  Axis: Normal  Intervals:none  ST&T Change: No ST segment elevation or depression.  No abnormal T wave inversion.  ____________________________________________  RADIOLOGY  No acute finding on the chest x-ray, T-spine nor head CT. ____________________________________________   PROCEDURES  Procedure(s) performed:   Procedures  Critical Care performed:   ____________________________________________   INITIAL IMPRESSION / ASSESSMENT AND PLAN / ED COURSE  Pertinent labs & imaging results that were available during my care of the patient were reviewed by me and considered in my medical decision making (see chart for details).  Differential diagnosis includes, but is not limited to, intracranial hemorrhage, meningitis/encephalitis, previous head trauma, cavernous venous thrombosis, tension headache, temporal arteritis, migraine or migraine equivalent, idiopathic intracranial  hypertension, and non-specific headache. Differential diagnosis includes, but is not limited to, ACS, aortic dissection, pulmonary embolism, cardiac tamponade, pneumothorax, pneumonia, pericarditis, myocarditis, GI-related causes including esophagitis/gastritis, and musculoskeletal chest wall pain.   As part of my medical decision making, I reviewed the following data within the electronic MEDICAL RECORD NUMBER Notes from prior ED visits   ----------------------------------------- 10:07 PM on 09/09/2017 -----------------------------------------  Patient appears more comfortable after Percocet and is ranging her neck freely.  Very reassuring chest x-ray as well as CTs of the head and cervical spine.  Likely worsening of her pain secondary to inflammatory response 1 day status post fall and injury of the head as well as C-spine and back.  Patient will be discharged with a muscle relaxer.  She is understanding of the diagnosis as well as treatment plan and willing to comply.     ____________________________________________   FINAL CLINICAL IMPRESSION(S) / ED DIAGNOSES  Concussion.  Chest wall pain.  Muscular skeletal back pain.    NEW MEDICATIONS STARTED DURING THIS VISIT:  New Prescriptions   No medications on file     Note:  This document was prepared using Dragon voice recognition  software and may include unintentional dictation errors.     Myrna BlazerSchaevitz, Fiorela Pelzer Matthew, MD 09/09/17 [phone]

## 2018-03-10 ENCOUNTER — Emergency Department
Admission: EM | Admit: 2018-03-10 | Discharge: 2018-03-11 | Disposition: A | Discharge status: Home / Self Care | Payer: Self-pay | Attending: Emergency Medicine | Admitting: Emergency Medicine

## 2018-03-10 ENCOUNTER — Other Ambulatory Visit: Payer: Self-pay

## 2018-03-10 DIAGNOSIS — K047 Periapical abscess without sinus: Secondary | ICD-10-CM

## 2018-03-10 MED ORDER — OXYCODONE-ACETAMINOPHEN 5-325 MG PO TABS
1.0000 | ORAL_TABLET | Freq: Once | ORAL | Status: AC
  Administered 2018-03-10: 1 via ORAL
  Filled 2018-03-10: qty 1

## 2018-03-10 MED ORDER — LIDOCAINE VISCOUS HCL 2 % MT SOLN
15.0000 mL | Freq: Once | OROMUCOSAL | Status: AC
  Administered 2018-03-10: 15 mL via OROMUCOSAL
  Filled 2018-03-10: qty 15

## 2018-03-10 MED ORDER — LIDOCAINE VISCOUS HCL 2 % MT SOLN: 10.0000 mL | OROMUCOSAL | 0 refills | Status: DC | PRN

## 2018-03-10 MED ORDER — IBUPROFEN 600 MG PO TABS: 600.0000 mg | ORAL_TABLET | Freq: Four times a day (QID) | ORAL | 0 refills | Status: AC | PRN

## 2018-03-10 MED ORDER — AMOXICILLIN 500 MG PO CAPS
500.0000 mg | ORAL_CAPSULE | Freq: Once | ORAL | Status: AC
  Administered 2018-03-10: 500 mg via ORAL
  Filled 2018-03-10: qty 1

## 2018-03-10 MED ORDER — AMOXICILLIN 500 MG PO CAPS: 500.0000 mg | ORAL_CAPSULE | Freq: Three times a day (TID) | ORAL | 0 refills | Status: DC

## 2018-03-10 NOTE — ED Triage Notes (Signed)
PT to ED reporting lower left sided dental pain since yesterday. Pt reported having a fever at home but denies having checked her temp with a thermometer. Pt reports having pain in the entire jaw but she knows she has a lot of cavities in one molar.

## 2018-03-10 NOTE — ED Notes (Signed)
Pt reports dental pain left lower side since yesterday reports she does not see a dentist, denies taking any OTC medications for pain, reports she felt warm to touch not sure if she had a fever or not

## 2018-03-10 NOTE — ED Provider Notes (Signed)
[facility] Emergency Department Provider Note  ____________________________________________  Time seen: Approximately 10:53 PM  I have reviewed the triage vital signs and the nursing notes.   HISTORY  Chief Complaint Dental Pain    HPI Cindy Fox is a 27 y.o. female that presents emergency department for evaluation of left bottom dental pain for 1 day.  Patient does not have a dentist.  She has not checked her temperature but feels warm.  No difficulty opening or closing mouth.  No swelling.   Past Medical History:  Diagnosis Date  . Medical history non-contributory     Patient Active Problem List   Diagnosis Date Noted  . Indication for care in labor or delivery 05/21/2015  . 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 CYST EXCISION    . OVARIAN CYST REMOVAL  2015    Prior to Admission medications   Medication Sig Start Date End Date Taking? Authorizing Provider  amoxicillin (AMOXIL) 500 MG capsule Take 1 capsule (500 mg total) by mouth 3 (three) times daily. 03/10/18   Enid DerryWagner, Shon Indelicato, PA-C  carisoprodol (SOMA) 350 MG tablet Take 1 tablet (350 mg total) by mouth 3 (three) times daily as needed for muscle spasms. 09/09/17 09/09/18  Myrna BlazerSchaevitz, David Matthew, MD  ibuprofen (ADVIL,MOTRIN) 600 MG tablet Take 1 tablet (600 mg total) by mouth every 6 (six) hours as needed. 03/10/18   Enid DerryWagner, Serrena Linderman, PA-C  lidocaine (XYLOCAINE) 2 % solution Use as directed 10 mLs in the mouth or throat as needed for mouth pain. 03/10/18   Enid DerryWagner, Marie Chow, PA-C  traMADol (ULTRAM) 50 MG tablet Take 1 tablet (50 mg total) by mouth every 6 (six) hours as needed. Patient not taking: Reported on 09/08/2017 03/30/17   Rebecka ApleyWebster, Allison P, MD    Allergies Patient has no known allergies.  No family history on file.  Social History Social History   Tobacco Use  . Smoking status: Never  Smoker  . Smokeless tobacco: Never Used  Substance Use Topics  . Alcohol use: No  . Drug use: No     Review of Systems  Gastrointestinal:  No nausea, no vomiting.  Musculoskeletal: Negative for musculoskeletal pain. Skin: Negative for rash, abrasions, lacerations, ecchymosis. Neurological: Negative for headaches   ____________________________________________   PHYSICAL EXAM:  VITAL SIGNS: ED Triage Vitals  Enc Vitals Group     BP 03/10/18 2113 129/80     Pulse Rate 03/10/18 2113 76     Resp 03/10/18 2113 18     Temp 03/10/18 2113 98.6 F (37 C)     Temp Source 03/10/18 2113 Oral     SpO2 03/10/18 2113 100 %     Weight 03/10/18 2114 130 lb (59 kg)     Height 03/10/18 2114 5\' 1"  (1.549 m)     Head Circumference --      Peak Flow --      Pain Score 03/10/18 2150 10     Pain Loc --      Pain Edu? --      Excl. in GC? --      Constitutional: Alert and oriented. Well appearing and in no acute distress. Eyes: Conjunctivae are normal. PERRL. EOMI. Head: Atraumatic. ENT:      Ears:      Nose: No congestion/rhinnorhea.      Mouth/Throat: Mucous membranes are moist.  Large cavity in left bottom molar with surrounding tenderness  to palpation.  No drainable abscess.  No visible swelling.  No difficulty opening or closing mouth. Neck: No stridor.  Cardiovascular: Good peripheral circulation. Respiratory: Normal respiratory effort without tachypnea or retractions.  Musculoskeletal: Full range of motion to all extremities. No gross deformities appreciated. Neurologic:  Normal speech and language. No gross focal neurologic deficits are appreciated.  Skin:  Skin is warm, dry and intact. No rash noted. Psychiatric: Mood and affect are normal. Speech and behavior are normal. Patient exhibits appropriate insight and judgement.   ____________________________________________   LABS (all labs ordered are listed, but only abnormal results are displayed)  Labs Reviewed - No data  to display ____________________________________________  EKG   ____________________________________________  RADIOLOGY   No results found.  ____________________________________________    PROCEDURES  Procedure(s) performed:    Procedures    Medications  amoxicillin (AMOXIL) capsule 500 mg (500 mg Oral Given 03/10/18 2251)  lidocaine (XYLOCAINE) 2 % viscous mouth solution 15 mL (15 mLs Mouth/Throat Given 03/10/18 2251)  oxyCODONE-acetaminophen (PERCOCET/ROXICET) 5-325 MG per tablet 1 tablet (1 tablet Oral Given 03/10/18 2251)     ____________________________________________   INITIAL IMPRESSION / ASSESSMENT AND PLAN / ED COURSE  Pertinent labs & imaging results that were available during my care of the patient were reviewed by me and considered in my medical decision making (see chart for details).  Review of the [place] CSRS was performed in accordance of the [facility] prior to dispensing any controlled drugs.   Patient's diagnosis is consistent with dental abscess.  Vital signs and exam are reassuring.  Patient was given Percocet, viscous lidocaine, amoxicillin in ED.  Patient will be discharged home with prescriptions for viscous lidocaine, amoxicillin.  Dental resources were provided.  Patient is to follow up with dentist as directed. Patient is given ED precautions to return to the ED for any worsening or new symptoms.     ____________________________________________  FINAL CLINICAL IMPRESSION(S) / ED DIAGNOSES  Final diagnoses:  Dental abscess      NEW MEDICATIONS STARTED DURING THIS VISIT:  ED Discharge Orders        Ordered    amoxicillin (AMOXIL) 500 MG capsule  3 times daily     03/10/18 2329    lidocaine (XYLOCAINE) 2 % solution  As needed     03/10/18 2329    ibuprofen (ADVIL,MOTRIN) 600 MG tablet  Every 6 hours PRN     03/10/18 2329          This chart was dictated using voice recognition software/Dragon. Despite best efforts to proofread,  errors can occur which can change the meaning. Any change was purely unintentional.    Enid Derry, PA-C 03/10/18 2343    Pershing Proud Myra Rude, MD 03/11/18 [phone]

## 2018-05-06 ENCOUNTER — Other Ambulatory Visit: Payer: Self-pay

## 2018-05-06 ENCOUNTER — Emergency Department
Admission: EM | Admit: 2018-05-06 | Discharge: 2018-05-06 | Disposition: A | Discharge status: Home / Self Care | Payer: Self-pay | Attending: Student in an Organized Health Care Education/Training Program | Admitting: Student in an Organized Health Care Education/Training Program

## 2018-05-06 DIAGNOSIS — J020 Streptococcal pharyngitis: Secondary | ICD-10-CM | POA: Insufficient documentation

## 2018-05-06 LAB — GROUP A STREP BY PCR: Group A Strep by PCR: DETECTED — AB

## 2018-05-06 MED ORDER — ACETAMINOPHEN 325 MG PO TABS
ORAL_TABLET | ORAL | Status: AC
  Filled 2018-05-06: qty 2

## 2018-05-06 MED ORDER — AMOXICILLIN 500 MG PO CAPS: 500.0000 mg | ORAL_CAPSULE | Freq: Three times a day (TID) | ORAL | 0 refills | Status: AC

## 2018-05-06 MED ORDER — ACETAMINOPHEN 325 MG PO TABS
650.0000 mg | ORAL_TABLET | Freq: Once | ORAL | Status: AC
  Administered 2018-05-06: 650 mg via ORAL

## 2018-05-06 MED ORDER — AMOXICILLIN 500 MG PO CAPS
500.0000 mg | ORAL_CAPSULE | Freq: Once | ORAL | Status: AC
  Administered 2018-05-06: 500 mg via ORAL
  Filled 2018-05-06: qty 1

## 2018-05-06 NOTE — ED Triage Notes (Signed)
Pt in with co sore throat, swollen lymph noted, body aches, and chills since yesterday. Has not had anything for pain or fever. Denies any n.v.d or dysuria.

## 2018-05-06 NOTE — ED Notes (Signed)
Pt stated that since yesterday she has had fever, body aches, chills, sore throat, bilateral ear pain and headache. Denies any n/v/d or urinary problems. Pt was given tylenol in triage, pt stated that the tylenol helped.

## 2018-05-06 NOTE — ED Provider Notes (Signed)
[facility] Emergency Department Provider Note  ____________________________________________   First MD Initiated Contact with Patient 05/06/18 2057     (approximate)  I have reviewed the triage vital signs and the nursing notes.   HISTORY  Chief Complaint Fever    HPI Cindy Fox is a 27 y.o. female presents emergency department complaining of sore throat, fever, chills and body aches.  Some headache.  She denies any nausea vomiting or diarrhea.  Symptoms started yesterday.  She took Tylenol which has not really helped.  She denies any vomiting or diarrhea.    Past Medical History:  Diagnosis Date  . Medical history non-contributory     Patient Active Problem List   Diagnosis Date Noted  . Indication for care in labor or delivery 05/21/2015  . 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 CYST EXCISION    . OVARIAN CYST REMOVAL  2015    Prior to Admission medications   Medication Sig Start Date End Date Taking? Authorizing Provider  amoxicillin (AMOXIL) 500 MG capsule Take 1 capsule (500 mg total) by mouth 3 (three) times daily. 05/06/18   Fisher, Roselyn BeringSusan W, PA-C  carisoprodol (SOMA) 350 MG tablet Take 1 tablet (350 mg total) by mouth 3 (three) times daily as needed for muscle spasms. 09/09/17 09/09/18  Myrna BlazerSchaevitz, David Matthew, MD  ibuprofen (ADVIL,MOTRIN) 600 MG tablet Take 1 tablet (600 mg total) by mouth every 6 (six) hours as needed. 03/10/18   Enid DerryWagner, Ashley, PA-C  lidocaine (XYLOCAINE) 2 % solution Use as directed 10 mLs in the mouth or throat as needed for mouth pain. 03/10/18   Enid DerryWagner, Ashley, PA-C  traMADol (ULTRAM) 50 MG tablet Take 1 tablet (50 mg total) by mouth every 6 (six) hours as needed. Patient not taking: Reported on 09/08/2017 03/30/17   Rebecka ApleyWebster, Allison P, MD    Allergies Patient has no known allergies.  No family history on  file.  Social History Social History   Tobacco Use  . Smoking status: Never Smoker  . Smokeless tobacco: Never Used  Substance Use Topics  . Alcohol use: No  . Drug use: No    Review of Systems  Constitutional: Positive fever/chills Eyes: No visual changes. ENT: Positive sore throat. Respiratory: Denies cough Genitourinary: Negative for dysuria. Musculoskeletal: Negative for back pain. Skin: Negative for rash.    ____________________________________________   PHYSICAL EXAM:  VITAL SIGNS: ED Triage Vitals [05/06/18 1939]  Enc Vitals Group     BP 105/73     Pulse Rate (!) 118     Resp 20     Temp 100 F (37.8 C)     Temp Source Oral     SpO2 99 %     Weight 130 lb 1.1 oz (59 kg)     Height      Head Circumference      Peak Flow      Pain Score 7     Pain Loc      Pain Edu?      Excl. in GC?     Constitutional: Alert and oriented. Well appearing and in no acute distress. Eyes: Conjunctivae are normal.  Head: Atraumatic. Nose: No congestion/rhinnorhea. Mouth/Throat: Mucous membranes are moist.  Throat is red and swollen, no exudate is noted, Neck:  supple no lymphadenopathy noted Cardiovascular: Normal rate, regular rhythm. Heart sounds are normal Respiratory: Normal respiratory effort.  No retractions, lungs  c t a  GU: deferred Musculoskeletal: FROM all extremities, warm and well perfused Neurologic:  Normal speech and language.  Skin:  Skin is warm, dry and intact. No rash noted. Psychiatric: Mood and affect are normal. Speech and behavior are normal.  ____________________________________________   LABS (all labs ordered are listed, but only abnormal results are displayed)  Labs Reviewed  GROUP A STREP BY PCR - Abnormal; Notable for the following components:      Result Value   Group A Strep by PCR DETECTED (*)    All other components within normal limits    ____________________________________________   ____________________________________________  RADIOLOGY    ____________________________________________   PROCEDURES  Procedure(s) performed: No  Procedures    ____________________________________________   INITIAL IMPRESSION / ASSESSMENT AND PLAN / ED COURSE  Pertinent labs & imaging results that were available during my care of the patient were reviewed by me and considered in my medical decision making (see chart for details).   Patient is a 27 year old female presents to the emergency department with a sore throat, fever and body aches.  Symptoms for 1 day.  On physical exam patient appears well but febrile.  The throat is red and swollen.  Strep test is positive.  Test results were explained to the patient.  She was given a dose of amoxicillin while here in the emergency department.  She is given a prescription for amoxicillin 500 mg 3 times daily.  She is to follow-up with her regular doctor or return emergency department if not better 5 7 days.  Return to the ER if worsening.  She was discharged in stable condition.  Clinical Course as of May 06 2237  Thu May 06, 2018  2112 Group A Strep by PCR(!): DETECTED [CF]  2112 Group A Strep by PCR(!): DETECTED [CF]    Clinical Course User Index [CF] Fransico Michael, Cadence H, Student-PA    As part of my medical decision making, I reviewed the following data within the electronic MEDICAL RECORD NUMBER Nursing notes reviewed and incorporated, Labs reviewed strep test is positive, Notes from prior ED visits and [place] Controlled Substance Database  ____________________________________________   FINAL CLINICAL IMPRESSION(S) / ED DIAGNOSES  Final diagnoses:  Strep throat      NEW MEDICATIONS STARTED DURING THIS VISIT:  Discharge Medication List as of 05/06/2018  9:36 PM       Note:  This document was prepared using Dragon voice recognition software and may include unintentional  dictation errors.    Faythe Ghee, PA-C 05/06/18 2241    Willy Eddy, MD 05/06/18 [phone]

## 2018-10-19 ENCOUNTER — Other Ambulatory Visit: Payer: Self-pay

## 2018-10-19 ENCOUNTER — Encounter: Payer: Self-pay | Admitting: Emergency Medicine

## 2018-10-19 DIAGNOSIS — R1031 Right lower quadrant pain: Secondary | ICD-10-CM | POA: Insufficient documentation

## 2018-10-19 DIAGNOSIS — Z79899 Other long term (current) drug therapy: Secondary | ICD-10-CM | POA: Insufficient documentation

## 2018-10-19 LAB — CBC WITH DIFFERENTIAL/PLATELET
Abs Immature Granulocytes: 0.08 10*3/uL — ABNORMAL HIGH (ref 0.00–0.07)
BASOS ABS: 0 10*3/uL (ref 0.0–0.1)
Basophils Relative: 0 %
EOS ABS: 0.3 10*3/uL (ref 0.0–0.5)
Eosinophils Relative: 2 %
HCT: 40.4 % (ref 36.0–46.0)
Hemoglobin: 13.2 g/dL (ref 12.0–15.0)
IMMATURE GRANULOCYTES: 1 %
LYMPHS ABS: 3.8 10*3/uL (ref 0.7–4.0)
Lymphocytes Relative: 32 %
MCH: 27.8 pg (ref 26.0–34.0)
MCHC: 32.7 g/dL (ref 30.0–36.0)
MCV: 85.2 fL (ref 80.0–100.0)
Monocytes Absolute: 0.5 10*3/uL (ref 0.1–1.0)
Monocytes Relative: 4 %
NEUTROS PCT: 61 %
NRBC: 0 % (ref 0.0–0.2)
Neutro Abs: 7.5 10*3/uL (ref 1.7–7.7)
Platelets: 356 10*3/uL (ref 150–400)
RBC: 4.74 MIL/uL (ref 3.87–5.11)
RDW: 12.4 % (ref 11.5–15.5)
WBC: 12.2 10*3/uL — ABNORMAL HIGH (ref 4.0–10.5)

## 2018-10-19 LAB — URINALYSIS, COMPLETE (UACMP) WITH MICROSCOPIC
Bilirubin Urine: NEGATIVE
Glucose, UA: NEGATIVE mg/dL
KETONES UR: NEGATIVE mg/dL
Nitrite: NEGATIVE
PH: 5 (ref 5.0–8.0)
PROTEIN: NEGATIVE mg/dL
Specific Gravity, Urine: 1.024 (ref 1.005–1.030)

## 2018-10-19 LAB — COMPREHENSIVE METABOLIC PANEL
ALT: 16 U/L (ref 0–44)
AST: 17 U/L (ref 15–41)
Albumin: 4.4 g/dL (ref 3.5–5.0)
Alkaline Phosphatase: 70 U/L (ref 38–126)
Anion gap: 9 (ref 5–15)
BILIRUBIN TOTAL: 0.2 mg/dL — AB (ref 0.3–1.2)
BUN: 13 mg/dL (ref 6–20)
CHLORIDE: 103 mmol/L (ref 98–111)
CO2: 28 mmol/L (ref 22–32)
CREATININE: 0.6 mg/dL (ref 0.44–1.00)
Calcium: 9.3 mg/dL (ref 8.9–10.3)
GFR calc Af Amer: >60 mL/min (ref >60)
Glucose, Bld: 110 mg/dL — ABNORMAL HIGH (ref 70–99)
Potassium: 3.8 mmol/L (ref 3.5–5.1)
Sodium: 140 mmol/L (ref 135–145)
TOTAL PROTEIN: 8.5 g/dL — AB (ref 6.5–8.1)

## 2018-10-19 LAB — POCT PREGNANCY, URINE: PREG TEST UR: NEGATIVE

## 2018-10-19 LAB — LIPASE, BLOOD: LIPASE: 27 U/L (ref 11–51)

## 2018-10-19 NOTE — ED Triage Notes (Signed)
Patient ambulatory to triage with steady gait, without difficulty or distress noted; pt reports left flank pain radiating around into lower abd since yesterday with no accomp symptoms

## 2018-10-20 ENCOUNTER — Emergency Department
Admission: EM | Admit: 2018-10-20 | Discharge: 2018-10-20 | Disposition: A | Discharge status: Home / Self Care | Payer: Self-pay | Attending: Emergency Medicine | Admitting: Emergency Medicine

## 2018-10-20 ENCOUNTER — Emergency Department: Payer: Self-pay

## 2018-10-20 DIAGNOSIS — R1031 Right lower quadrant pain: Secondary | ICD-10-CM

## 2018-10-20 MED ORDER — TRAMADOL HCL 50 MG PO TABS: 50.0000 mg | ORAL_TABLET | Freq: Four times a day (QID) | ORAL | 0 refills | Status: AC | PRN

## 2018-10-20 MED ORDER — KETOROLAC TROMETHAMINE 30 MG/ML IJ SOLN
30.0000 mg | Freq: Once | INTRAMUSCULAR | Status: AC
  Administered 2018-10-20: 30 mg via INTRAMUSCULAR
  Filled 2018-10-20: qty 1

## 2018-10-20 NOTE — ED Provider Notes (Signed)
[facility] Emergency Department Provider Note   ____________________________________________    I have reviewed the triage vital signs and the nursing notes.   HISTORY  Chief Complaint Flank Pain     HPI Cindy Fox is a 28 y.o. female who presents with complaints of lower abdominal pain.  Patient describes right greater than left lower abdominal pain which started yesterday and has been relatively consistent.  She has never had this before.  Denies fevers or chills.  No history of abdominal surgery.  Mild dysuria, normal stools.  Has not taken anything for this.  Describes the pain as sharp  Past Medical History:  Diagnosis Date  . Medical history non-contributory     Patient Active Problem List   Diagnosis Date Noted  . Indication for care in labor or delivery 05/21/2015  . 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 CYST EXCISION    . OVARIAN CYST REMOVAL  2015    Prior to Admission medications   Medication Sig Start Date End Date Taking? Authorizing Provider  amoxicillin (AMOXIL) 500 MG capsule Take 1 capsule (500 mg total) by mouth 3 (three) times daily. 05/06/18   Fisher, Roselyn Bering, PA-C  ibuprofen (ADVIL,MOTRIN) 600 MG tablet Take 1 tablet (600 mg total) by mouth every 6 (six) hours as needed. 03/10/18   Enid Derry, PA-C  lidocaine (XYLOCAINE) 2 % solution Use as directed 10 mLs in the mouth or throat as needed for mouth pain. 03/10/18   Enid Derry, PA-C  traMADol (ULTRAM) 50 MG tablet Take 1 tablet (50 mg total) by mouth every 6 (six) hours as needed. 10/20/18 10/20/19  Jene Every, MD     Allergies Patient has no known allergies.  No family history on file.  Social History Social History   Tobacco Use  . Smoking status: Never Smoker  . Smokeless tobacco: Never Used  Substance Use Topics  . Alcohol use: No  . Drug use: No     Review of Systems  Constitutional: No fever/chills Eyes: No visual changes.  ENT: No sore throat. Cardiovascular: Denies chest pain. Respiratory: Denies shortness of breath. Gastrointestinal: As above Genitourinary: Negative for dysuria.  No vaginal discharge Musculoskeletal: Negative for back pain. Skin: Negative for rash. Neurological: Negative for headaches    ____________________________________________   PHYSICAL EXAM:  VITAL SIGNS: ED Triage Vitals  Enc Vitals Group     BP 10/19/18 2146 125/78     Pulse Rate 10/19/18 2146 85     Resp 10/19/18 2146 18     Temp 10/19/18 2146 98 F (36.7 C)     Temp Source 10/19/18 2146 Oral     SpO2 10/19/18 2146 100 %     Weight 10/19/18 2145 64.4 kg (142 lb)     Height 10/19/18 2145 1.549 m (5\' 1" )     Head Circumference --      Peak Flow --      Pain Score 10/19/18 2145 5     Pain Loc --      Pain Edu? --      Excl. in GC? --     Constitutional: Alert and oriented. e Eyes: Conjunctivae are normal.   Nose: No congestion/rhinnorhea. Mouth/Throat: Mucous membranes are moist.    Cardiovascular: Normal rate, regular rhythm. Grossly normal heart sounds.  Good peripheral circulation. Respiratory: Normal respiratory effort.  No retractions. Gastrointestinal: Tenderness palpation the right lower  quadrant, mild, no distention, no CVA tenderness Genitourinary: deferred Musculoskeletal: .  Warm and well perfused Neurologic:  Normal speech and language. No gross focal neurologic deficits are appreciated.  Skin:  Skin is warm, dry and intact. No rash noted. Psychiatric: Mood and affect are normal. Speech and behavior are normal.  ____________________________________________   LABS (all labs ordered are listed, but only abnormal results are displayed)  Labs Reviewed  CBC WITH DIFFERENTIAL/PLATELET - Abnormal; Notable for the following components:      Result Value   WBC 12.2 (*)    Abs Immature Granulocytes 0.08 (*)     All other components within normal limits  COMPREHENSIVE METABOLIC PANEL - Abnormal; Notable for the following components:   Glucose, Bld 110 (*)    Total Protein 8.5 (*)    Total Bilirubin 0.2 (*)    All other components within normal limits  URINALYSIS, COMPLETE (UACMP) WITH MICROSCOPIC - Abnormal; Notable for the following components:   Color, Urine YELLOW (*)    APPearance HAZY (*)    Hgb urine dipstick SMALL (*)    Leukocytes,Ua TRACE (*)    Bacteria, UA RARE (*)    All other components within normal limits  LIPASE, BLOOD  POCT PREGNANCY, URINE   ____________________________________________  EKG  None ____________________________________________  RADIOLOGY  CT renal stone study negative for appendicitis or ureterolithiasis ____________________________________________   PROCEDURES  Procedure(s) performed: No  Procedures   Critical Care performed: No ____________________________________________   INITIAL IMPRESSION / ASSESSMENT AND PLAN / ED COURSE  Pertinent labs & imaging results that were available during my care of the patient were reviewed by me and considered in my medical decision making (see chart for details).  Presents with right lower quadrant pain primarily some left lower quadrant pain, differential includes urinary tract infection, ureterolithiasis, appendicitis.  Treated with IM Toradol, CT renal stone study negative for appendicitis ureterolithiasis.  Urinalysis overall unremarkable.  Lab work is reassuring.  Discussed with patient and recommended we obtain ultrasound to evaluate for possible ovarian cyst however she reports she is feeling much better and does not want to wait for that test, she would like to be discharged and she will return if symptoms worsen.  I feel this is reasonable will discharge with tramadol    ____________________________________________   FINAL CLINICAL IMPRESSION(S) / ED DIAGNOSES  Final diagnoses:  Right lower  quadrant abdominal pain        Note:  This document was prepared using Dragon voice recognition software and may include unintentional dictation errors.   Jene Every, MD 10/20/18 [phone]

## 2018-10-20 NOTE — ED Notes (Signed)
Pt to CT

## 2018-10-20 NOTE — ED Notes (Signed)
Dr. Cyril Loosen at the bedside to re-evaluate pt and discuss results.

## 2018-10-31 IMAGING — CR DG LUMBAR SPINE COMPLETE 4+V
1 series · 5 of 5 positions shown · non-contrast
Comparison: None.

CLINICAL DATA: Acute lower back pain after fall at work today. No
radiculopathy.

EXAM:
LUMBAR SPINE - COMPLETE 4+ VIEW

[Series 1: dg lumbar spine complete 4 +v · 0.14mm/px · 5 of 5 slices shown]
[im 1/5]
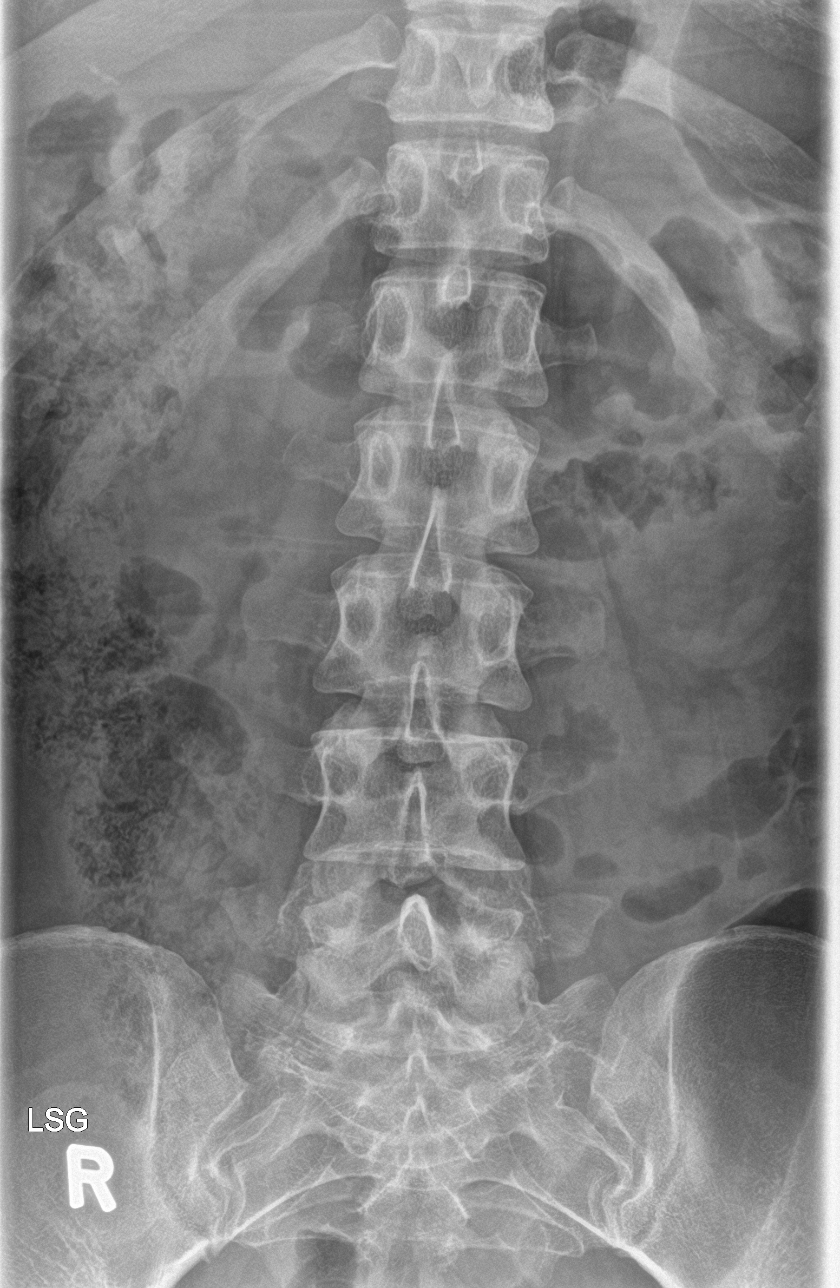
[im 2/5]
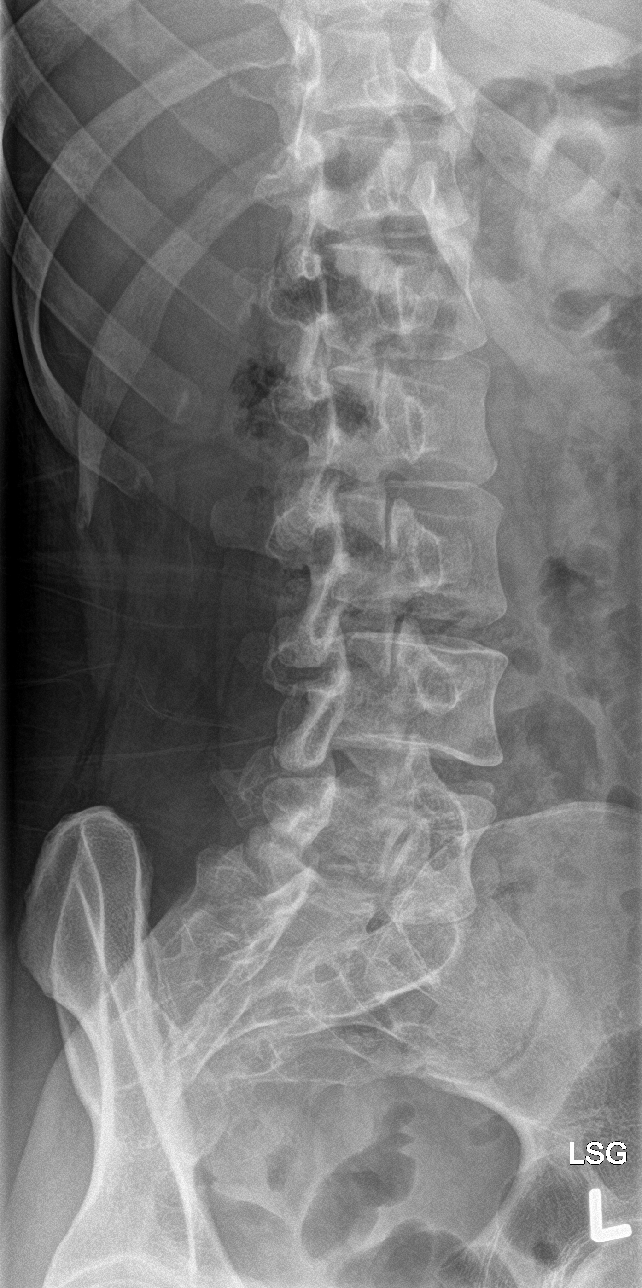
[im 3/5]
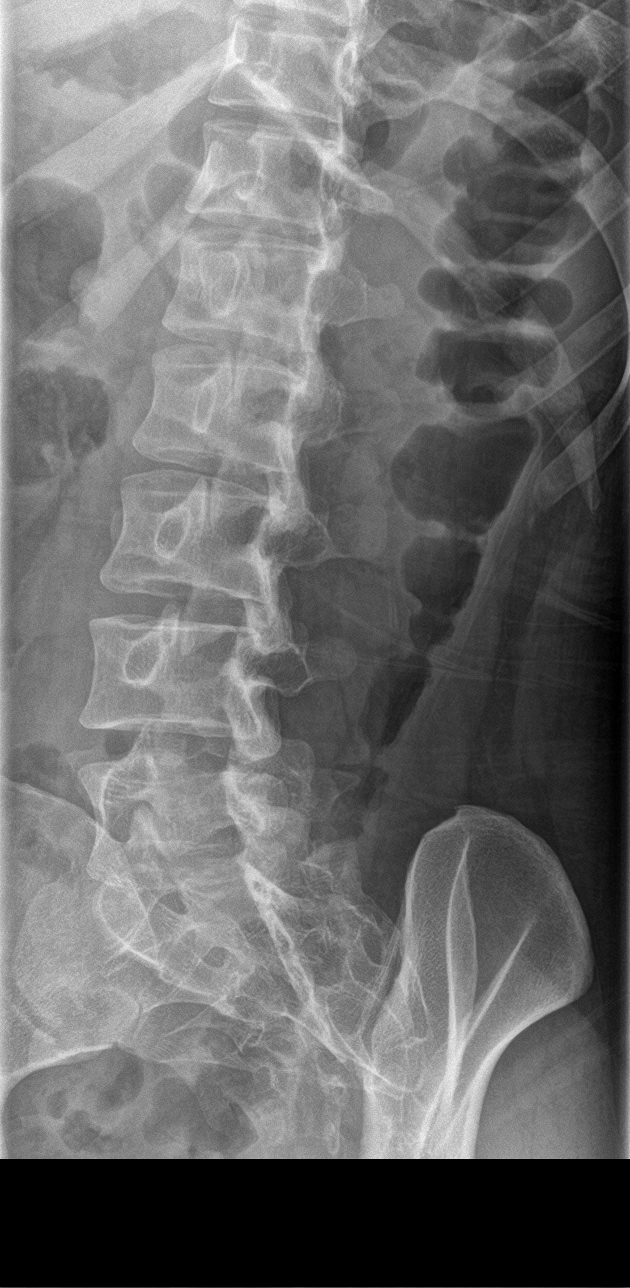
[im 4/5]
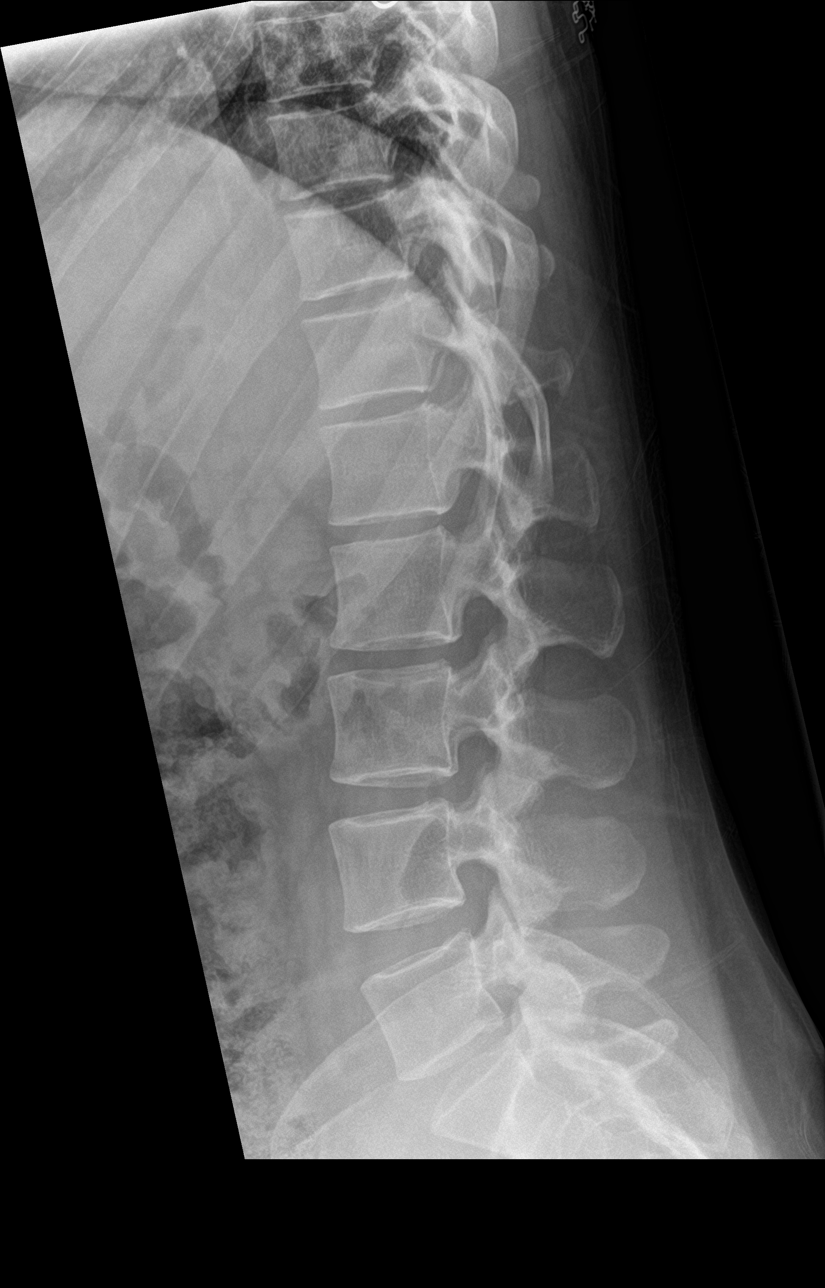
[im 5/5]
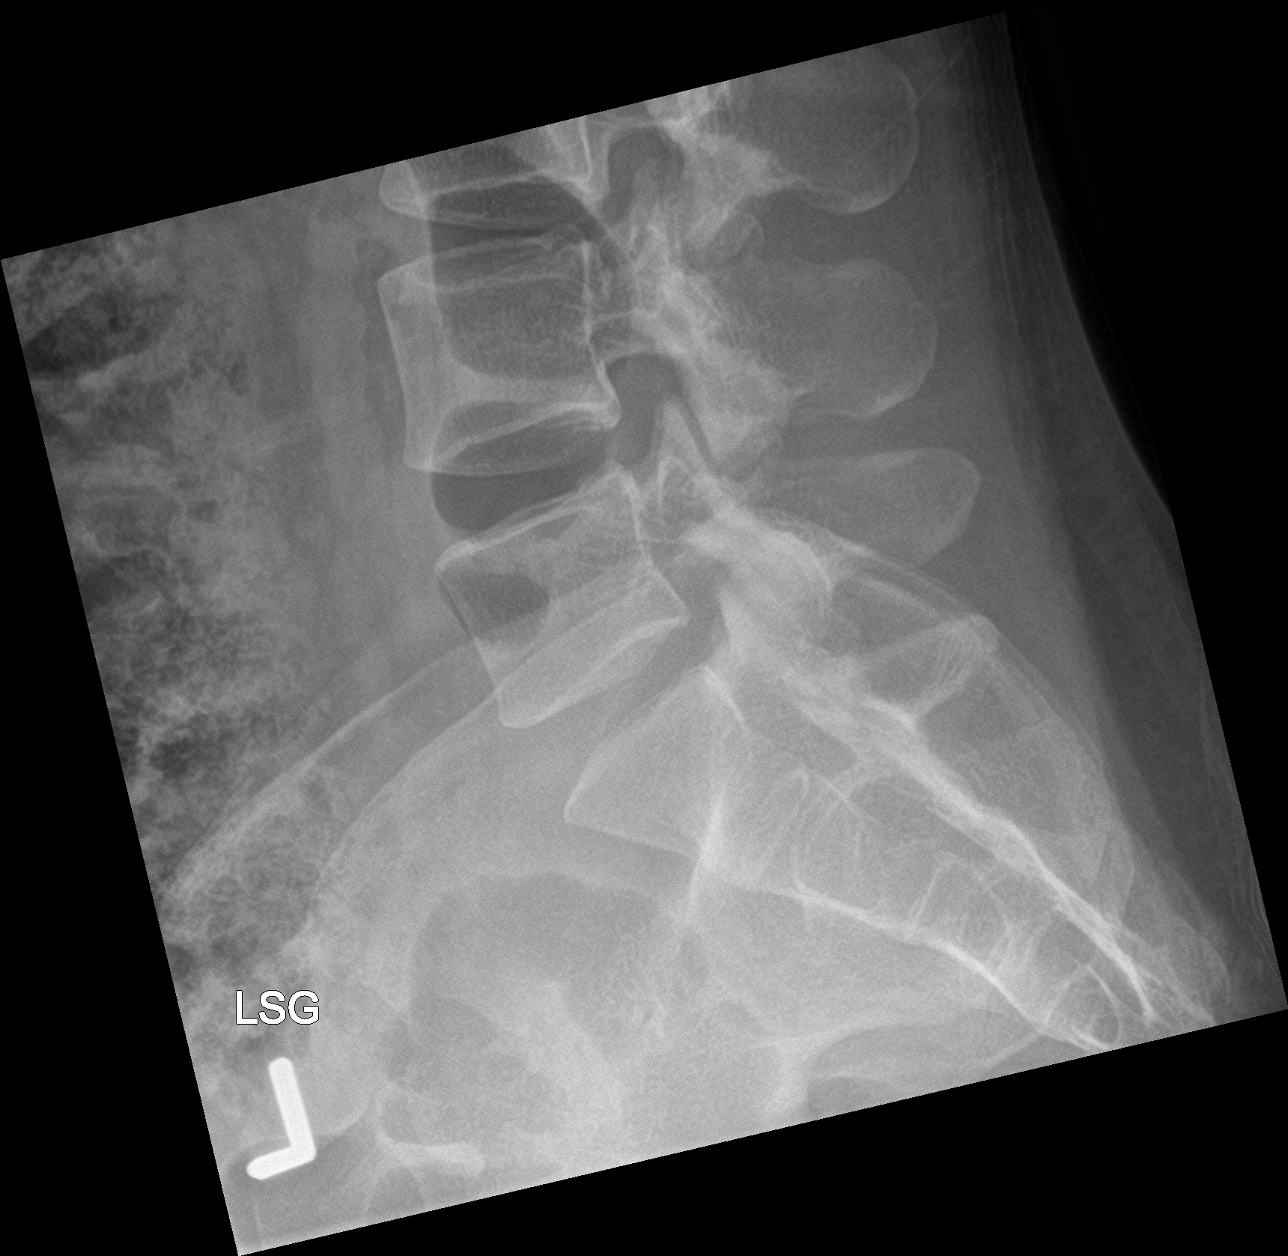

[5 of 5 positions shown; findings below may reference images not displayed]

FINDINGS: There is no evidence of lumbar spine fracture. Alignment is normal.
Intervertebral disc spaces are maintained.
IMPRESSION: Normal lumbar spine.

## 2019-09-13 ENCOUNTER — Ambulatory Visit: Payer: Self-pay

## 2019-09-20 ENCOUNTER — Ambulatory Visit: Payer: Self-pay

## 2019-09-20 ENCOUNTER — Other Ambulatory Visit: Payer: Self-pay

## 2019-09-27 ENCOUNTER — Ambulatory Visit: Payer: Self-pay

## 2020-12-29 ENCOUNTER — Emergency Department
Admission: EM | Admit: 2020-12-29 | Discharge: 2020-12-30 | Disposition: A | Discharge status: Home / Self Care | Payer: Self-pay | Attending: Student in an Organized Health Care Education/Training Program | Admitting: Student in an Organized Health Care Education/Training Program

## 2020-12-29 ENCOUNTER — Other Ambulatory Visit: Payer: Self-pay

## 2020-12-29 ENCOUNTER — Emergency Department: Payer: Self-pay

## 2020-12-29 DIAGNOSIS — R202 Paresthesia of skin: Secondary | ICD-10-CM | POA: Insufficient documentation

## 2020-12-29 DIAGNOSIS — R519 Headache, unspecified: Secondary | ICD-10-CM | POA: Insufficient documentation

## 2020-12-29 MED ORDER — PROCHLORPERAZINE EDISYLATE 10 MG/2ML IJ SOLN
10.0000 mg | Freq: Once | INTRAMUSCULAR | Status: AC
  Administered 2020-12-29: 10 mg via INTRAVENOUS
  Filled 2020-12-29: qty 2

## 2020-12-29 MED ORDER — DIPHENHYDRAMINE HCL 50 MG/ML IJ SOLN
12.5000 mg | Freq: Once | INTRAMUSCULAR | Status: AC
  Administered 2020-12-29: 12.5 mg via INTRAVENOUS
  Filled 2020-12-29: qty 1

## 2020-12-29 MED ORDER — ACETAMINOPHEN 500 MG PO TABS
1000.0000 mg | ORAL_TABLET | Freq: Once | ORAL | Status: AC
  Administered 2020-12-29: 1000 mg via ORAL
  Filled 2020-12-29: qty 2

## 2020-12-29 NOTE — ED Notes (Signed)
Interpretor used for [employer] interview.

## 2020-12-29 NOTE — ED Triage Notes (Addendum)
Interpreter: [id number]. Pt reports yest at noon while driving developed R side headache, blurry vision, BLE weakness and R facial numbness. Reports took motrin until the nighttime and went to bed. Woke up with ongoing R facial numbness and BLE weakness. Ambulatory w/o difficulty, minor R lip abnormality, sensation equal, strength equal in extremities, no ataxia, able to speak in complete sentences.

## 2020-12-29 NOTE — ED Provider Notes (Signed)
[facility] Emergency Department Provider Note    Event Date/Time   First MD Initiated Contact with Patient 12/29/20 2320     (approximate)  I have reviewed the triage vital signs and the nursing notes.   HISTORY  Chief Complaint Facial Pain and Headache    HPI Cindy Fox is a 30 y.o. female with no significant past medical history presents to the ER for evaluation of 4 to 24 hours of bad headache associated with tingling in the right side of her face.  States that yesterday she was having visual changes where she felt like she was being blinded by bright light.  Also felt like she has had some lower extremity tingling and weakness.  Able to ambulate.  Symptoms started yesterday when she was driving.  Is during the day.  Is never had symptoms like this before.  No recent fevers.  No history of blood clots.  No chest pain or shortness of breath.  No recent fevers or illness.    Past Medical History:  Diagnosis Date  . Medical history non-contributory    History reviewed. No pertinent family history. Past Surgical History:  Procedure Laterality Date  . CYST EXCISION    . OVARIAN CYST REMOVAL  2015   Patient Active Problem List   Diagnosis Date Noted  . Indication for care in labor or delivery 05/21/2015  . IUGR (intrauterine growth restriction) affecting care of mother 05/18/2015      Prior to Admission medications   Medication Sig Start Date End Date Taking? Authorizing Provider  amoxicillin (AMOXIL) 500 MG capsule Take 1 capsule (500 mg total) by mouth 3 (three) times daily. 05/06/18   Fisher, Roselyn Bering, PA-C  ibuprofen (ADVIL,MOTRIN) 600 MG tablet Take 1 tablet (600 mg total) by mouth every 6 (six) hours as needed. 03/10/18   Enid Derry, PA-C  lidocaine (XYLOCAINE) 2 % solution Use as directed 10 mLs in the mouth or throat as needed for mouth pain. 03/10/18   Enid Derry, PA-C    Allergies Patient has no known allergies.    Social  History Social History   Tobacco Use  . Smoking status: Never Smoker  . Smokeless tobacco: Never Used  Substance Use Topics  . Alcohol use: No  . Drug use: No    Review of Systems Patient denies headaches, rhinorrhea, blurry vision, numbness, shortness of breath, chest pain, edema, cough, abdominal pain, nausea, vomiting, diarrhea, dysuria, fevers, rashes or hallucinations unless otherwise stated above in HPI. ____________________________________________   PHYSICAL EXAM:  VITAL SIGNS: Vitals:   12/30/20 0122 12/30/20 0125  BP:    Pulse: 70 77  Resp: 18 18  Temp: 98.2 F (36.8 C) 98.2 F (36.8 C)  SpO2: 96% 96%    Constitutional: Alert and oriented.  Eyes: Conjunctivae are normal.  Head: Atraumatic. Nose: No congestion/rhinnorhea. Mouth/Throat: Mucous membranes are moist.   Neck: No stridor. Painless ROM.  Cardiovascular: Normal rate, regular rhythm. Grossly normal heart sounds.  Good peripheral circulation. Respiratory: Normal respiratory effort.  No retractions. Lungs CTAB. Gastrointestinal: Soft and nontender. No distention. No abdominal bruits. No CVA tenderness. Genitourinary:  Musculoskeletal: No lower extremity tenderness nor edema.  No joint effusions. Neurologic:  CN- intact.  No facial droop, Normal FNF.  Normal heel to shin.  Sensation intact bilaterally. Normal speech and language. No gross focal neurologic deficits are appreciated. No gait instability.  Skin:  Skin is warm, dry and intact. No rash noted. Psychiatric: Mood and affect are  normal. Speech and behavior are normal.  ____________________________________________   LABS (all labs ordered are listed, but only abnormal results are displayed)  Results for orders placed or performed during the hospital encounter of 12/29/20 (from the past 24 hour(s))  CBC     Status: Abnormal   Collection Time: 12/29/20 11:37 PM  Result Value Ref Range   WBC 13.7 (H) 4.0 - 10.5 K/uL   RBC 4.55 3.87 - 5.11 MIL/uL    Hemoglobin 12.4 12.0 - 15.0 g/dL   HCT 78.6 76.7 - 20.9 %   MCV 82.2 80.0 - 100.0 fL   MCH 27.3 26.0 - 34.0 pg   MCHC 33.2 30.0 - 36.0 g/dL   RDW 47.0 96.2 - 83.6 %   Platelets 327 150 - 400 K/uL   nRBC 0.0 0.0 - 0.2 %  Comprehensive metabolic panel     Status: Abnormal   Collection Time: 12/29/20 11:37 PM  Result Value Ref Range   Sodium 136 135 - 145 mmol/L   Potassium 3.5 3.5 - 5.1 mmol/L   Chloride 102 98 - 111 mmol/L   CO2 26 22 - 32 mmol/L   Glucose, Bld 101 (H) 70 - 99 mg/dL   BUN 16 6 - 20 mg/dL   Creatinine, Ser 6.29 0.44 - 1.00 mg/dL   Calcium 9.4 8.9 - 47.6 mg/dL   Total Protein 8.3 (H) 6.5 - 8.1 g/dL   Albumin 4.4 3.5 - 5.0 g/dL   AST 19 15 - 41 U/L   ALT 15 0 - 44 U/L   Alkaline Phosphatase 63 38 - 126 U/L   Total Bilirubin 0.5 0.3 - 1.2 mg/dL   GFR, Estimated >54 >65 mL/min   Anion gap 8 5 - 15  POC Urine Pregnancy, ED     Status: None   Collection Time: 12/30/20 12:00 AM  Result Value Ref Range   Preg Test, Ur Negative Negative   ____________________________________________  ____________________________________________  RADIOLOGY  I personally reviewed all radiographic images ordered to evaluate for the above acute complaints and reviewed radiology reports and findings.  These findings were personally discussed with the patient.  Please see medical record for radiology report.  ____________________________________________   PROCEDURES  Procedure(s) performed:  Procedures    Critical Care performed: no ____________________________________________   INITIAL IMPRESSION / ASSESSMENT AND PLAN / ED COURSE  Pertinent labs & imaging results that were available during my care of the patient were reviewed by me and considered in my medical decision making (see chart for details).   DDX: migraine, tension, sah, iph, mass, cluster, mening  Cindy Fox is a 30 y.o. who presents to the ED with symptoms as described above.  CT ordered in triage is  reassuring.  Patient nontoxic-appearing with greater than 24 hours of headache.  Was not sudden onset.  Have a lower suspicion for subarachnoid.  Describing some sensory changes without clear anatomic explanation.  Have recommended MRI.  Will treat for possible complex migraine.  Clinical Course as of 12/30/20 0154  Wynelle Link Dec 30, 2020  0151 Patient notified nurse that she is needing to be discharged at this time.  Feels much improved.  And states that her child is at home sick.  Repeat exam reassuring.  She is clinically well.  She was unable to tolerate MRV but MRI without evidence of acute abnormality.  I think given constellation of symptoms and her improvement CVST is unlikely.  Have discussed with the patient and available family all diagnostics and treatments  performed thus far and all questions were answered to the best of my ability. The patient demonstrates understanding and agreement with plan.  [PR]    Clinical Course User Index [PR] Willy Eddy, MD    The patient was evaluated in Emergency Department today for the symptoms described in the history of present illness. He/she was evaluated in the context of the global COVID-19 pandemic, which necessitated consideration that the patient might be at risk for infection with the SARS-CoV-2 virus that causes COVID-19. Institutional protocols and algorithms that pertain to the evaluation of patients at risk for COVID-19 are in a state of rapid change based on information released by regulatory bodies including the CDC and federal and state organizations. These policies and algorithms were followed during the patient's care in the ED.  As part of my medical decision making, I reviewed the following data within the electronic MEDICAL RECORD NUMBER Nursing notes reviewed and incorporated, Labs reviewed, notes from prior ED visits and  Controlled Substance Database   ____________________________________________   FINAL CLINICAL IMPRESSION(S) / ED  DIAGNOSES  Final diagnoses:  Bad headache      NEW MEDICATIONS STARTED DURING THIS VISIT:  New Prescriptions   No medications on file     Note:  This document was prepared using Dragon voice recognition software and may include unintentional dictation errors.    Willy Eddy, MD 12/30/20 [phone]

## 2020-12-30 ENCOUNTER — Emergency Department: Payer: Self-pay

## 2020-12-30 LAB — COMPREHENSIVE METABOLIC PANEL
ALT: 15 U/L (ref 0–44)
AST: 19 U/L (ref 15–41)
Albumin: 4.4 g/dL (ref 3.5–5.0)
Alkaline Phosphatase: 63 U/L (ref 38–126)
Anion gap: 8 (ref 5–15)
BUN: 16 mg/dL (ref 6–20)
CO2: 26 mmol/L (ref 22–32)
Calcium: 9.4 mg/dL (ref 8.9–10.3)
Chloride: 102 mmol/L (ref 98–111)
Creatinine, Ser: 0.8 mg/dL (ref 0.44–1.00)
GFR, Estimated: >60 mL/min (ref >60)
Glucose, Bld: 101 mg/dL — ABNORMAL HIGH (ref 70–99)
Potassium: 3.5 mmol/L (ref 3.5–5.1)
Sodium: 136 mmol/L (ref 135–145)
Total Bilirubin: 0.5 mg/dL (ref 0.3–1.2)
Total Protein: 8.3 g/dL — ABNORMAL HIGH (ref 6.5–8.1)

## 2020-12-30 LAB — CBC
HCT: 37.4 % (ref 36.0–46.0)
Hemoglobin: 12.4 g/dL (ref 12.0–15.0)
MCH: 27.3 pg (ref 26.0–34.0)
MCHC: 33.2 g/dL (ref 30.0–36.0)
MCV: 82.2 fL (ref 80.0–100.0)
Platelets: 327 10*3/uL (ref 150–400)
RBC: 4.55 MIL/uL (ref 3.87–5.11)
RDW: 13.2 % (ref 11.5–15.5)
WBC: 13.7 10*3/uL — ABNORMAL HIGH (ref 4.0–10.5)
nRBC: 0 % (ref 0.0–0.2)

## 2020-12-30 LAB — POC URINE PREG, ED: Preg Test, Ur: NEGATIVE

## 2020-12-30 MED ORDER — LACTATED RINGERS IV BOLUS
1000.0000 mL | Freq: Once | INTRAVENOUS | Status: AC
  Administered 2020-12-30: 1000 mL via INTRAVENOUS

## 2020-12-30 MED ORDER — GADOBUTROL 1 MMOL/ML IV SOLN: 5.0000 mL | Freq: Once | INTRAVENOUS | Status: DC | PRN

## 2020-12-30 NOTE — ED Notes (Signed)
Discharge instructions reviewed with patient, no further questions.  VSS.  Respirations even and unlabored.  No acute distress noted.

## 2022-12-16 ENCOUNTER — Emergency Department: Payer: Self-pay

## 2022-12-16 DIAGNOSIS — Y92410 Unspecified street and highway as the place of occurrence of the external cause: Secondary | ICD-10-CM | POA: Insufficient documentation

## 2022-12-16 DIAGNOSIS — M25512 Pain in left shoulder: Secondary | ICD-10-CM | POA: Insufficient documentation

## 2022-12-16 DIAGNOSIS — M542 Cervicalgia: Secondary | ICD-10-CM | POA: Diagnosis present

## 2022-12-16 DIAGNOSIS — R519 Headache, unspecified: Secondary | ICD-10-CM | POA: Insufficient documentation

## 2022-12-16 DIAGNOSIS — S199XXA Unspecified injury of neck, initial encounter: Secondary | ICD-10-CM | POA: Diagnosis not present

## 2022-12-16 DIAGNOSIS — M25532 Pain in left wrist: Secondary | ICD-10-CM | POA: Diagnosis not present

## 2022-12-16 NOTE — ED Triage Notes (Signed)
Pt c/o pain to left shoulder left arm, left neck. Denies LOC. Denies other injuries.
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
## 2022-12-17 ENCOUNTER — Emergency Department
Admission: EM | Admit: 2022-12-17 | Discharge: 2022-12-17 | Disposition: A | Discharge status: Home / Self Care | Payer: Self-pay | Attending: Emergency Medicine | Admitting: Emergency Medicine

## 2022-12-17 DIAGNOSIS — S199XXA Unspecified injury of neck, initial encounter: Secondary | ICD-10-CM

## 2022-12-17 DIAGNOSIS — M25512 Pain in left shoulder: Secondary | ICD-10-CM

## 2022-12-17 DIAGNOSIS — M25532 Pain in left wrist: Secondary | ICD-10-CM

## 2022-12-17 MED ORDER — ACETAMINOPHEN 500 MG PO TABS
1000.0000 mg | ORAL_TABLET | Freq: Once | ORAL | Status: AC
  Administered 2022-12-17: 1000 mg via ORAL
  Filled 2022-12-17: qty 2

## 2022-12-17 NOTE — Discharge Instructions (Signed)
Take acetaminophen 650 mg and ibuprofen 400 mg every 6 hours for pain.  Take with food.  
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
## 2024-08-08 ENCOUNTER — Other Ambulatory Visit: Payer: Self-pay | Admitting: Family Medicine

## 2024-08-08 ENCOUNTER — Ambulatory Visit (LOCAL_COMMUNITY_HEALTH_CENTER): Payer: Self-pay

## 2024-08-08 VITALS — BP 110/71 | Ht 61.0 in | Wt 142.5 lb

## 2024-08-08 DIAGNOSIS — O0933 Supervision of pregnancy with insufficient antenatal care, third trimester: Secondary | ICD-10-CM | POA: Insufficient documentation

## 2024-08-08 DIAGNOSIS — Z3201 Encounter for pregnancy test, result positive: Secondary | ICD-10-CM

## 2024-08-08 DIAGNOSIS — Z3A28 28 weeks gestation of pregnancy: Secondary | ICD-10-CM

## 2024-08-08 LAB — PREGNANCY, URINE: Preg Test, Ur: POSITIVE — AB

## 2024-08-08 MED ORDER — PRENATAL 27-0.8 MG PO TABS: 1.0000 | ORAL_TABLET | Freq: Every day | ORAL | Status: AC

## 2024-08-08 NOTE — Progress Notes (Signed)
 U/S scheduled for 08/12/24 at 1:45 at [facility] Patient needs to arrive at 1:30, with full bladder and 1 adult (no kids). Hulan Parish, RN

## 2024-08-08 NOTE — Progress Notes (Signed)
 Patient presented to RN clinic today for a pregnancy test. Reports a hx of irregular periods and cannot recall the last period she had. Reports she has been throwing up for 3 months. Hx of IUGR.   1. [redacted] weeks gestation of pregnancy (Primary)  - US  OB Comp + 14 Wk; Future  2. Late prenatal care affecting pregnancy in third trimester -desires to have delivery at [facility] -ordered US  today -reports NV- reviewed Vitamin B6 as first line tx. Counseled to take BID  -endorses feeling numbness in hands when she wakes up -reports + FM, and occasional contractions. Denies LOF/VB, edema or vaginal bleeding -reviewed recommendation to have blood work done ASAP given late entry to care. Offered to do today- but patient states they cannot stay long. Pt will come back tomorrow. Agrees to Maternity 21 and carrier screening.   - US  OB Comp + 14 Wk; Future  Due to language barrier, a Spanish interpreter Kemp T.) was present in person during the history-taking, subsequent discussion, and physical exam with this patient.    [facility] FNP-C

## 2024-08-08 NOTE — Progress Notes (Signed)
 UPT positive. Spanish interpreter -Gladies Roche present  Desires prenatal care at [facility]. Pt was unsure of LMP. States she has irregular periods and does not keep track. Pt stated she took a PT at home was positive. States took test because feeling flutters in abdomen. Stated she has been vomiting for about 3 months. Went to consult with provider -d/t unable to determine LMP.SABRA H.Middleton, FNP came to measure fundal height- measuring 28wks. Provider discussed having pt get 28wk blood work. Pt returning tomorrow Am for 28wk labs . Also advised Maternity would schedule an ultrasound . Will f/u with pt when hear from maternity nurse .  Positive Pregnancy test packet given /reviewed .    The patient was dispensed PNV #100  today per S.O. C.Lynn, MD. I provided counseling today regarding the medication. We discussed the medication, the side effects and when to call clinic. Patient given the opportunity to ask questions. Questions answered.

## 2024-08-09 ENCOUNTER — Other Ambulatory Visit: Payer: Self-pay

## 2024-08-09 ENCOUNTER — Encounter: Payer: Self-pay | Admitting: Family Medicine

## 2024-08-09 ENCOUNTER — Other Ambulatory Visit: Payer: Self-pay | Admitting: Family Medicine

## 2024-08-09 DIAGNOSIS — O99019 Anemia complicating pregnancy, unspecified trimester: Secondary | ICD-10-CM

## 2024-08-09 DIAGNOSIS — Z3493 Encounter for supervision of normal pregnancy, unspecified, third trimester: Secondary | ICD-10-CM

## 2024-08-09 DIAGNOSIS — Z3490 Encounter for supervision of normal pregnancy, unspecified, unspecified trimester: Secondary | ICD-10-CM | POA: Insufficient documentation

## 2024-08-09 DIAGNOSIS — Z3A28 28 weeks gestation of pregnancy: Secondary | ICD-10-CM

## 2024-08-09 DIAGNOSIS — O365930 Maternal care for other known or suspected poor fetal growth, third trimester, not applicable or unspecified: Secondary | ICD-10-CM

## 2024-08-09 LAB — HEMOGLOBIN, FINGERSTICK: Hemoglobin: 8.2 g/dL — ABNORMAL LOW (ref 11.1–15.9)

## 2024-08-09 MED ORDER — FERROUS SULFATE 325 (65 FE) MG PO TBEC: 325.0000 mg | DELAYED_RELEASE_TABLET | ORAL | Status: AC

## 2024-08-09 NOTE — Progress Notes (Signed)
 Patient was here for labs today, scheduled for new OB appointment on 08/15/24 for RN portion and 08/23/24 for provider portion. Hulan Parish, RN

## 2024-08-09 NOTE — Progress Notes (Signed)
 Patient presented to nurse clinic 08/08/2024 and had a positive pregnancy test. Unknown LMP, but FNP [facility] measured her fundal height ~ 28 cm and was able to hear fetal heart tones easily. Given patient intends to establish care with [facility], brief visit conducted yesterday with FNP [facility]. Anatomy scan ordered.   Today, patient presents for lab collection, including routine 28 week labs (given her FH). After labs, patient will go to clerks to complete presumptive medicaid application and full routine new OB appointment (although we are booking out through January at this time).   Pregnancy, unspecified gestational age -     Pregnancy, Initial Screen -     Hgb Fractionation Cascade -     Hemoglobin, fingerstick -     QuantiFERON-TB Gold Plus -     Varicella zoster antibody, IgG -     Glucose, 1 hour -     RPR W/RFLX TO RPR TITER, TREPONEMAL AB, SCREEN AND DIAGNOSIS -     Inheritest 14-Gene Panel -     MaterniT21 PLUS Core+SCA -     Lead, blood (adult age 81 yrs or greater)  Hx preterm delivery in 2016 w/ G3 -     AMB referral to maternal fetal medicine  Hx IUGR in 2016 w/ G3 -     AMB referral to maternal fetal medicine   Dorothyann Helling, MD 08/09/2024  9:19 AM

## 2024-08-09 NOTE — Progress Notes (Signed)
 In nurse clinic today for 28wk labs. Orders for labs were entered by provider . Added anemia panel d/t low hgb.  Interpreter :[employer] (Spanish ) ID# [phone] used for visit today.  Instructions reviewed for test today. Drank glucose within 5 min. Time finished 9:43am. Due in lab for blood draw 10:43am. Lab notified.  Pt while waiting for lab draw went to clerical to initiate presumptive eligibility and set up [facility] appt 08/30/23. Pt returned to nurse clinic after lab draw.  Hgb=8.2g/dl, reviewed with patient . D/t low hgb Iron Supplement dispensed to patient. Did advise [facility] nurse K.Brewer,RN of Hgb results .  The patient was dispensed Iron #100 today. I provided counseling today regarding the medication. We discussed the medication, the side effects and when to call clinic. Patient given the opportunity to ask questions. Questions answered.     [facility] needed this nurse to inform pt of when Ultrasound appointment is 12/19 needs to arrive @130pm   K.Brewer,RN was planning to speak with pt after nurse clinic visit .

## 2024-08-10 ENCOUNTER — Ambulatory Visit: Payer: Self-pay | Admitting: Family Medicine

## 2024-08-10 DIAGNOSIS — Z3A31 31 weeks gestation of pregnancy: Secondary | ICD-10-CM

## 2024-08-10 DIAGNOSIS — O99810 Abnormal glucose complicating pregnancy: Secondary | ICD-10-CM

## 2024-08-10 DIAGNOSIS — D509 Iron deficiency anemia, unspecified: Secondary | ICD-10-CM | POA: Insufficient documentation

## 2024-08-10 LAB — FE+CBC/D/PLT+TIBC+FER+RETIC
Basophils Absolute: 0 x10E3/uL (ref 0.0–0.2)
Basos: 0 %
EOS (ABSOLUTE): 0.1 x10E3/uL (ref 0.0–0.4)
Eos: 2 %
Ferritin: 6 ng/mL — ABNORMAL LOW (ref 15–150)
Hematocrit: 26.3 % — ABNORMAL LOW (ref 34.0–46.6)
Hemoglobin: 8.1 g/dL — ABNORMAL LOW (ref 11.1–15.9)
Immature Grans (Abs): 0 x10E3/uL (ref 0.0–0.1)
Immature Granulocytes: 0 %
Iron Saturation: 4 % — CL (ref 15–55)
Iron: 26 ug/dL — ABNORMAL LOW (ref 27–159)
Lymphocytes Absolute: 2.5 x10E3/uL (ref 0.7–3.1)
Lymphs: 32 %
MCH: 22.2 pg — ABNORMAL LOW (ref 26.6–33.0)
MCHC: 30.8 g/dL — ABNORMAL LOW (ref 31.5–35.7)
MCV: 72 fL — ABNORMAL LOW (ref 79–97)
Monocytes Absolute: 0.3 x10E3/uL (ref 0.1–0.9)
Monocytes: 4 %
Neutrophils Absolute: 4.9 x10E3/uL (ref 1.4–7.0)
Neutrophils: 62 %
Platelets: 347 x10E3/uL (ref 150–450)
RBC: 3.65 x10E6/uL — ABNORMAL LOW (ref 3.77–5.28)
RDW: 14.4 % (ref 11.7–15.4)
Retic Ct Pct: 1.4 % (ref 0.6–2.6)
Total Iron Binding Capacity: 607 ug/dL (ref 250–450)
UIBC: 581 ug/dL — ABNORMAL HIGH (ref 131–425)
WBC: 7.8 x10E3/uL (ref 3.4–10.8)

## 2024-08-10 NOTE — Progress Notes (Signed)
 Anemia panel consistent with iron deficiency anemia. Already started on PO iron every other day. Next check 4 weeks. Low threshold for IV iron infusion if Hgb not responding well to PO. Chart updated. To be discussed with patient at next prenatal appointment.    Dorothyann Helling, MD 08/10/2024  3:38 PM

## 2024-08-12 ENCOUNTER — Telehealth: Payer: Self-pay | Admitting: Family Medicine

## 2024-08-12 ENCOUNTER — Ambulatory Visit: Payer: Self-pay

## 2024-08-12 ENCOUNTER — Ambulatory Visit
Admission: RE | Admit: 2024-08-12 | Discharge: 2024-08-12 | Disposition: A | Discharge status: Home / Self Care | Payer: Self-pay | Source: Ambulatory Visit | Attending: Family Medicine | Admitting: Family Medicine

## 2024-08-12 DIAGNOSIS — O0933 Supervision of pregnancy with insufficient antenatal care, third trimester: Secondary | ICD-10-CM | POA: Insufficient documentation

## 2024-08-12 DIAGNOSIS — O4103X0 Oligohydramnios, third trimester, not applicable or unspecified: Secondary | ICD-10-CM | POA: Insufficient documentation

## 2024-08-12 DIAGNOSIS — Z3A28 28 weeks gestation of pregnancy: Secondary | ICD-10-CM | POA: Insufficient documentation

## 2024-08-12 DIAGNOSIS — Z363 Encounter for antenatal screening for malformations: Secondary | ICD-10-CM | POA: Insufficient documentation

## 2024-08-15 ENCOUNTER — Ambulatory Visit: Payer: Self-pay | Admitting: Family Medicine

## 2024-08-15 ENCOUNTER — Encounter: Payer: Self-pay | Admitting: Family Medicine

## 2024-08-15 ENCOUNTER — Telehealth: Payer: Self-pay

## 2024-08-15 VITALS — BP 109/75 | HR 76 | Temp 97.9°F | Ht 61.5 in | Wt 142.6 lb

## 2024-08-15 DIAGNOSIS — A749 Chlamydial infection, unspecified: Secondary | ICD-10-CM

## 2024-08-15 DIAGNOSIS — Z3493 Encounter for supervision of normal pregnancy, unspecified, third trimester: Secondary | ICD-10-CM

## 2024-08-15 DIAGNOSIS — Z3490 Encounter for supervision of normal pregnancy, unspecified, unspecified trimester: Secondary | ICD-10-CM

## 2024-08-15 DIAGNOSIS — O4100X0 Oligohydramnios, unspecified trimester, not applicable or unspecified: Secondary | ICD-10-CM | POA: Insufficient documentation

## 2024-08-15 DIAGNOSIS — O4100X1 Oligohydramnios, unspecified trimester, fetus 1: Secondary | ICD-10-CM

## 2024-08-15 DIAGNOSIS — O98819 Other maternal infectious and parasitic diseases complicating pregnancy, unspecified trimester: Secondary | ICD-10-CM

## 2024-08-15 NOTE — Telephone Encounter (Signed)
 Patient returned phone call. Notified of lab result-chlamydia positive and need for her and partner(s) for treatment. Aware needs to eat breakfast prior to 10 a.m. appointment tomorrow morning. [facility] Spanish Interpreter utilized. BTHIELE RN

## 2024-08-15 NOTE — Telephone Encounter (Signed)
 Called patient using [employer]preter ID # [id number] x 2 attempts. Left voice mail to call health department regarding abnormal lab result that requires treatment. Call back number provided. BTHIELE RN

## 2024-08-15 NOTE — Progress Notes (Signed)
 Declined Flu and COVID Vaccine. Desires BTL but desires to complete consent form next RV. Anemia in Pregnancy  pamphlet given. Discussed Iron gummies for women. Called [place] and requested U/S reading per provider request. Pediatrician undecided; desires to breastfeed. Declined Centering in Pregnancy Program. BTHIELE RN

## 2024-08-15 NOTE — Progress Notes (Signed)
 Patient states she is vomiting Iron tablets, unable to tolerate. Recommended Safe Medication List items for nausea. Anemia in Pregnancy pamphlet given.

## 2024-08-16 ENCOUNTER — Ambulatory Visit: Payer: Self-pay

## 2024-08-16 VITALS — BP 109/68 | HR 83 | Temp 98.6°F | Wt 141.6 lb

## 2024-08-16 DIAGNOSIS — Z3A30 30 weeks gestation of pregnancy: Secondary | ICD-10-CM

## 2024-08-16 DIAGNOSIS — Z3480 Encounter for supervision of other normal pregnancy, unspecified trimester: Secondary | ICD-10-CM

## 2024-08-16 DIAGNOSIS — Z3483 Encounter for supervision of other normal pregnancy, third trimester: Secondary | ICD-10-CM

## 2024-08-16 DIAGNOSIS — O98819 Other maternal infectious and parasitic diseases complicating pregnancy, unspecified trimester: Secondary | ICD-10-CM

## 2024-08-16 DIAGNOSIS — D509 Iron deficiency anemia, unspecified: Secondary | ICD-10-CM

## 2024-08-16 DIAGNOSIS — A749 Chlamydial infection, unspecified: Secondary | ICD-10-CM

## 2024-08-16 MED ORDER — AZITHROMYCIN 500 MG PO TABS
1000.0000 mg | ORAL_TABLET | Freq: Once | ORAL | Status: AC
  Administered 2024-08-16: 1000 mg via ORAL

## 2024-08-16 NOTE — Progress Notes (Addendum)
 " [facility] [address], [address] [place] [address] Main phone: [phone]  Prenatal Visit  Subjective:  Cindy Fox is a 33 y.o. [phone] at [redacted]w[redacted]d being seen today seen today for acute early prenatal care. Planning to receive prenatal care here so worked in for STD treatment in pregnancy and vomiting iron supplements.   Patient Active Problem List   Diagnosis Date Noted   Chlamydia infection during pregnancy, antepartum 08/15/2024   Oligohydramnios antepartum 08/15/2024   Iron deficiency anemia 08/10/2024   Pregnancy 08/09/2024   Hx preterm delivery in 2016 w/ G3 08/09/2024   Late prenatal care w/ G4, established ~ 28-30 weeks @[facility]  08/08/2024   Hx IUGR in 2016 w/ G3 05/18/2015   HPI Patient reports she was throwing up before starting  iron supplements  and taking the iron supplements increased episodes of NV. She only endorses vomiting up the iron supplementation, she reports she is able to tolerate prenatals. After vomiting it takes ~ 1 hr before she can tolerate PO fluids.  She additionally endoreses lower pelvic discomfort and pain in her wrists..  Contractions: Not present. Vag. Bleeding: None.  Movement: Present. Pt Denies leaking of fluid/ROM.   The following portions of the patient's history were reviewed and updated as appropriate: allergies, current medications, past family history, past medical history, past social history, past surgical history and problem list. Problem list updated.  Objective:   Vitals:   08/16/24 0841  BP: 109/68  Pulse: 83  Temp: 98.6 F (37 C)  Weight: 141 lb 9.6 oz (64.2 kg)    Fetal Status: Fetal Heart Rate (bpm): 128 Fundal Height: 29 cm Movement: Present      General:  Alert, oriented and cooperative. Patient is in no acute distress.  Skin: Skin is warm and dry. No rash noted.   Cardiovascular: Normal heart rate noted  Respiratory: Normal respiratory effort, no  problems with respiration noted  Abdomen: Soft, gravid, appropriate for gestational age.  Pain/Pressure: Present     Pelvic: Cervical exam deferred        Extremities: Normal range of motion.  Edema: None  Mental Status: Normal mood and affect. Normal behavior. Normal judgment and thought content.   Assessment and Plan:  Pregnancy: G4P2103 at [redacted]w[redacted]d  1. [redacted] weeks gestation of pregnancy (Primary) -RV in 1 week.  2. Supervision of other normal pregnancy, antepartum -TWG:6 lb 9.6 oz (2.994 kg)   -Continues to take PNV without concerns.  Fetal movement noted, fundal height appropriate for GA.  -Discussed use of maternity belt for relief with pelvic discomfort. -Rec wrist splints at night to keep wrist in neutral positioning to prevent discomfort and relieve pressure.  3. Chlamydia infection during pregnancy, antepartum -Pt was tx per SO. Advised if she has episodes of vomitting within the next two hours post receiving medication to call the clinic or return to repeat treatment regimen with antiemetics.   - azithromycin  (ZITHROMAX ) tablet 1,000 mg  4. Iron deficiency anemia, unspecified iron deficiency anemia type -Counseled on NV related to oral iron supplements. Advised to continue PO iron with added vitamin B6 and Unisom if needed for relief from GI upset. -Discussed IV iron infusion for faster Hgb improvement with fewer GI side effects, pt agreed to IV infusion post counseling.  -Ordered IV iron x1  Preterm labor symptoms and general obstetric precautions including but not limited to vaginal bleeding, contractions, leaking of fluid and fetal movement were reviewed in detail with  the patient. Please refer to After Visit Summary for other counseling recommendations.  No follow-ups on file.  Future Appointments  Date Time Provider Department Center  08/23/2024  1:20 PM [facility] PROVIDER [facility] None   Due to language barrier, a Spanish interpreter Emmie SAILOR.) was present in person during  the history-taking, subsequent discussion, and physical exam with this patient.      Hardin GORMAN Pouch, NP "

## 2024-08-16 NOTE — Patient Instructions (Addendum)
"                ° °  Nausea medication to take during pregnancy:  Unisom (doxylamine succinate 25 mg tablets) Take one tablet daily at bedtime. If symptoms are not adequately controlled, the dose can be increased to a maximum recommended dose of two tablets daily (1/2 tablet in the morning, 1/2 tablet mid-afternoon and one at bedtime). Vitamin B6 100mg  tablets. Take one tablet twice a day (up to 200 mg per day).   Nuseas/Vmitos:  Bonine  Dramamine  Emetrol  Ginger (extracto)  Sea-Bands  Meclizine  Medicina para las nuseas que puede tomar durante el embarazo: Unisom (doxylamine succinate, pastillas de 25 mg) Tome una pastilla al da al [place]. Si los sntomas no estn adecuadamente controlados, la dosis puede aumentarse hasta una dosis mxima recomendada de liberty mutual al da (1/2 pastilla por la [place], 1/2 pastilla a media tarde y ignacia pastilla al [place]). Pastillas de Vitamina B6 de 100mg . Tome conagra foods veces al da (hasta 200 mg por da).     Wrist splints for carpal tunnel symptoms:Faja para el sindrome del [place]po [place]      Maternity Belt for Pain: faja de soporte de maternidad      "

## 2024-08-16 NOTE — Progress Notes (Signed)
 Treated for chlamydia per standing order. States npt currently with FOB, but will give him the partner card. Not taking iron tablet as causes nausea / vomiting. Burnadette Lowers, RN

## 2024-08-17 ENCOUNTER — Ambulatory Visit: Payer: Self-pay

## 2024-08-18 ENCOUNTER — Other Ambulatory Visit: Payer: Self-pay

## 2024-08-18 ENCOUNTER — Encounter: Payer: Self-pay | Admitting: Family Medicine

## 2024-08-21 LAB — PREGNANCY, INITIAL SCREEN
Antibody Screen: NEGATIVE
Basophils Absolute: 0 x10E3/uL (ref 0.0–0.2)
Basos: 0 %
Bilirubin, UA: NEGATIVE
Chlamydia trachomatis, NAA: POSITIVE — AB
EOS (ABSOLUTE): 0.1 x10E3/uL (ref 0.0–0.4)
Eos: 1 %
Glucose, UA: NEGATIVE
HCV Ab: NONREACTIVE
HIV Screen 4th Generation wRfx: NONREACTIVE
Hematocrit: 27.9 % — ABNORMAL LOW (ref 34.0–46.6)
Hemoglobin: 8 g/dL — ABNORMAL LOW (ref 11.1–15.9)
Hepatitis B Surface Ag: NEGATIVE
Immature Grans (Abs): 0 x10E3/uL (ref 0.0–0.1)
Immature Granulocytes: 0 %
Lymphocytes Absolute: 2.6 x10E3/uL (ref 0.7–3.1)
Lymphs: 33 %
MCH: 21.6 pg — ABNORMAL LOW (ref 26.6–33.0)
MCHC: 28.7 g/dL — ABNORMAL LOW (ref 31.5–35.7)
MCV: 75 fL — ABNORMAL LOW (ref 79–97)
Monocytes Absolute: 0.3 x10E3/uL (ref 0.1–0.9)
Monocytes: 4 %
Neisseria Gonorrhoeae by PCR: NEGATIVE
Neutrophils Absolute: 4.9 x10E3/uL (ref 1.4–7.0)
Neutrophils: 61 %
Nitrite, UA: NEGATIVE
Platelets: 345 x10E3/uL (ref 150–450)
RBC, UA: NEGATIVE
RBC: 3.7 x10E6/uL — ABNORMAL LOW (ref 3.77–5.28)
RDW: 14.2 % (ref 11.7–15.4)
RPR Ser Ql: NONREACTIVE
Rh Factor: POSITIVE
Rubella Antibodies, IGG: 9.77 {index}
Specific Gravity, UA: 1.027 (ref 1.005–1.030)
Urobilinogen, Ur: 1 mg/dL (ref 0.2–1.0)
WBC: 8 x10E3/uL (ref 3.4–10.8)
pH, UA: 5.5 (ref 5.0–7.5)

## 2024-08-21 LAB — MATERNIT21 PLUS CORE+SCA
Fetal Fraction: 44
Monosomy X (Turner Syndrome): NOT DETECTED
Result (T21): NEGATIVE
Trisomy 13 (Patau syndrome): NEGATIVE
Trisomy 18 (Edwards syndrome): NEGATIVE
Trisomy 21 (Down syndrome): NEGATIVE
XXX (Triple X Syndrome): NOT DETECTED
XXY (Klinefelter Syndrome): NOT DETECTED
XYY (Jacobs Syndrome): NOT DETECTED

## 2024-08-21 LAB — GLUCOSE, 1 HOUR GESTATIONAL: Gestational Diabetes Screen: 135 mg/dL (ref 70–139)

## 2024-08-21 LAB — LEAD, BLOOD (ADULT >= 16 YRS): Lead-Whole Blood: <1 ug/dL (ref 0.0–3.4)

## 2024-08-21 LAB — URINE CULTURE, OB REFLEX

## 2024-08-21 LAB — QUANTIFERON-TB GOLD PLUS
QuantiFERON Mitogen Value: 1.55 [IU]/mL
QuantiFERON Nil Value: 0.05 [IU]/mL
QuantiFERON TB1 Ag Value: 0.06 [IU]/mL
QuantiFERON TB2 Ag Value: 0.05 [IU]/mL
QuantiFERON-TB Gold Plus: NEGATIVE

## 2024-08-21 LAB — HGB FRACTIONATION CASCADE
Hgb A2: 2.3 % (ref 1.8–3.2)
Hgb A: 97.7 % (ref 96.4–98.8)
Hgb F: 0 % (ref 0.0–2.0)
Hgb S: 0 %

## 2024-08-21 LAB — MICROSCOPIC EXAMINATION
Casts: NONE SEEN /LPF
Epithelial Cells (non renal): >10 /HPF — AB (ref 0–10)
RBC, Urine: NONE SEEN /HPF (ref 0–2)
WBC, UA: NONE SEEN /HPF (ref 0–5)

## 2024-08-21 LAB — BEACON CARRIER SCREEN;14 GENES

## 2024-08-21 LAB — VARICELLA ZOSTER ANTIBODY, IGG: Varicella zoster IgG: REACTIVE

## 2024-08-21 LAB — HCV INTERPRETATION

## 2024-08-22 DIAGNOSIS — O99810 Abnormal glucose complicating pregnancy: Secondary | ICD-10-CM | POA: Insufficient documentation

## 2024-08-22 NOTE — Progress Notes (Signed)
 Reviewed initial prenatal labs. Below are notes, otherwise labs normal. Chart updated. To be discussed with patient at next prenatal appointment.   CT (+) - treated 08/16/24  Iron deficiency anemia - vomiting PO iron. NP Alexia Williams discussed IV iron infusion with patient.   Borderline 1 hr GTT @135 - will refer to nutrition for counseling.   Hx preterm delivery - referred to MFM 12/16, but no appointment yet scheduled.   Dorothyann Helling, MD 08/22/2024  9:50 AM

## 2024-08-22 NOTE — Addendum Note (Signed)
 Addended by: TRUDY HARDIN RAMAN on: 08/22/2024 10:28 AM   Modules accepted: Orders

## 2024-08-23 ENCOUNTER — Other Ambulatory Visit: Payer: Self-pay

## 2024-08-23 ENCOUNTER — Ambulatory Visit: Payer: Self-pay | Admitting: Family Medicine

## 2024-08-23 VITALS — BP 109/68 | HR 87 | Temp 98.1°F | Wt 140.8 lb

## 2024-08-23 DIAGNOSIS — R300 Dysuria: Secondary | ICD-10-CM

## 2024-08-23 DIAGNOSIS — Z23 Encounter for immunization: Secondary | ICD-10-CM

## 2024-08-23 DIAGNOSIS — O219 Vomiting of pregnancy, unspecified: Secondary | ICD-10-CM

## 2024-08-23 DIAGNOSIS — Z3483 Encounter for supervision of other normal pregnancy, third trimester: Secondary | ICD-10-CM

## 2024-08-23 DIAGNOSIS — Z3480 Encounter for supervision of other normal pregnancy, unspecified trimester: Secondary | ICD-10-CM

## 2024-08-23 DIAGNOSIS — D509 Iron deficiency anemia, unspecified: Secondary | ICD-10-CM

## 2024-08-23 DIAGNOSIS — A749 Chlamydial infection, unspecified: Secondary | ICD-10-CM

## 2024-08-23 DIAGNOSIS — Z3A31 31 weeks gestation of pregnancy: Secondary | ICD-10-CM

## 2024-08-23 DIAGNOSIS — B379 Candidiasis, unspecified: Secondary | ICD-10-CM

## 2024-08-23 DIAGNOSIS — Z124 Encounter for screening for malignant neoplasm of cervix: Secondary | ICD-10-CM

## 2024-08-23 DIAGNOSIS — O98813 Other maternal infectious and parasitic diseases complicating pregnancy, third trimester: Secondary | ICD-10-CM

## 2024-08-23 DIAGNOSIS — O4100X1 Oligohydramnios, unspecified trimester, fetus 1: Secondary | ICD-10-CM

## 2024-08-23 DIAGNOSIS — O26899 Other specified pregnancy related conditions, unspecified trimester: Secondary | ICD-10-CM

## 2024-08-23 LAB — WET PREP FOR TRICH, YEAST, CLUE
Clue Cell Exam: NEGATIVE
Trichomonas Exam: NEGATIVE

## 2024-08-23 MED ORDER — ONDANSETRON 4 MG PO TBDP: 4.0000 mg | ORAL_TABLET | Freq: Three times a day (TID) | ORAL | 0 refills | Status: AC | PRN

## 2024-08-23 MED ORDER — CLOTRIMAZOLE 1 % VA CREA: 1.0000 | TOPICAL_CREAM | Freq: Every day | VAGINAL | Status: AC

## 2024-08-23 NOTE — Progress Notes (Signed)
 IV iron infusion ordered in this encounter.  Hardin Pouch, NP 08/23/24

## 2024-08-23 NOTE — Progress Notes (Signed)
 " [facility] [address] [place] [address] Main phone: [phone]  Initial Prenatal Visit  Subjective:  Cindy Fox is a 33 y.o. [phone] at [redacted]w[redacted]d being seen today to start prenatal care at the [facility]. The following medical issues will be considered in the care of this low-risk pregnancy:   Patient Active Problem List   Diagnosis Date Noted   Abnormal glucose affecting pregnancy 08/22/2024   Chlamydia infection during pregnancy, antepartum 08/15/2024   Oligohydramnios antepartum 08/15/2024   Iron  deficiency anemia 08/10/2024   Pregnancy 08/09/2024   Hx preterm delivery in 2016 w/ G3 08/09/2024   Late prenatal care w/ G4, established ~ 28-30 weeks @[facility]  08/08/2024   Hx IUGR in 2016 w/ G3 05/18/2015   Patient reports having really bad lower abdominal cramping, has to wait until it subsides to move. She reports resting help. She reports N/V to the point where she can't keep food or fluids down, vomiting x4/day. She has tried to take vitamin b6, but reports throwing it up. Hasn't been able to eat, she attempts to eat and then 30 minutes later throws it up and after feels dizzy. She additionally reports vaginal discomfort and dysuria with urination. Denies leaking of fluid.   Symptom review and family concerns Any questions or concerns today: NV unable to tolerate food or fluids.  Nausea or vomiting: Yes Pelvic pain: Yes, lower abdominal cramping.  Vaginal bleeding: No How do you feel about being pregnant: Feels bad just because of the NV, and tired because of low iron  and this has never happened before. Was pregnancy planned: No  Dating LMP: ~Thinks October or November but doesn't remember the date Reliable? No. Any US  performed already? Yes, 08/12/24  Past history   Surgical history: Ovarian cyst removed  per pt report in 2015 Tobacco: No ETOH: No Drugs: No  Indications for  ASA therapy One of the following: Previous pregnancy with preeclampsia, especially early onset and with an adverse outcome No  Multifetal gestation No  Chronic hypertension No  Type 1 or 2 diabetes mellitus No  Chronic kidney disease No  Autoimmune disease (antiphospholipid syndrome, systemic lupus erythematosus) No   Two or more of the following: Nulliparity  No  Obesity (body mass index >30 kg/m2) No  Family history of preeclampsia in mother or sister No  Age >=35 years No  Sociodemographic characteristics (African American race, low socioeconomic level) Yes  Personal risk factors (eg, previous pregnancy with low birth weight or small for gestational age infant, previous adverse pregnancy outcome [eg, stillbirth], interval >10 years between pregnancies) No    The following portions of the patient's history were reviewed and updated as appropriate: allergies, current medications, past family history, past medical history, past social history, past surgical history and problem list. Problem list updated.  Objective:   Vitals:   08/23/24 1322  BP: 109/68  Pulse: 87  Temp: 98.1 F (36.7 C)  Weight: 140 lb 12.8 oz (63.9 kg)   Fetal Status: Fetal Heart Rate (bpm): 135 Fundal Height: 33 cm Movement: Present     Physical Exam Vitals and nursing note reviewed. Exam conducted with a chaperone present Kemp T).  Constitutional:      General: She is not in acute distress.    Appearance: Normal appearance. She is well-developed.  HENT:     Head: Normocephalic and atraumatic.     Right Ear: External ear normal.     Left  Ear: External ear normal.     Nose: Nose normal. No congestion or rhinorrhea.     Mouth/Throat:     Lips: Pink.     Mouth: Mucous membranes are moist.     Dentition: Normal dentition. No dental caries.     Pharynx: Oropharynx is clear. Uvula midline.     Comments: Dentition: no missing teeth noted Eyes:     General: No scleral icterus.    Conjunctiva/sclera:  Conjunctivae normal.  Neck:     Thyroid: No thyroid mass or thyromegaly.  Cardiovascular:     Rate and Rhythm: Normal rate.     Pulses: Normal pulses.     Comments: Extremities are warm and well perfused Pulmonary:     Effort: Pulmonary effort is normal.     Breath sounds: Normal breath sounds.  Chest:     Chest wall: No mass.  Breasts:    Tanner Score is 5.     Breasts are symmetrical.     Right: Normal. No mass, nipple discharge or skin change.     Left: Normal. No mass, nipple discharge or skin change.  Abdominal:     General: Abdomen is flat.     Palpations: Abdomen is soft.     Tenderness: There is no abdominal tenderness.     Comments: Gravid   Genitourinary:    General: Normal vulva.     Exam position: Lithotomy position.     Pubic Area: No rash.      Labia:        Right: No rash.        Left: No rash.      Uterus: Enlarged: Gravid.      Comments: Pap Collected. Musculoskeletal:     Right lower leg: No edema.     Left lower leg: No edema.  Lymphadenopathy:     Cervical: No cervical adenopathy.     Upper Body:     Right upper body: No axillary adenopathy.     Left upper body: No axillary adenopathy.  Skin:    General: Skin is warm.     Capillary Refill: Capillary refill takes less than 2 seconds.  Neurological:     Mental Status: She is alert.     Assessment and Plan:  Pregnancy: G4P2103 at [redacted]w[redacted]d  1. [redacted] weeks gestation of pregnancy (Primary) -RV in 2 weeks.  2. Supervision of other normal pregnancy, antepartum -Reviewed recommended weight gain: 15-25 lbs. Discussed healthy diet (avoiding soda, good sources of protein, fruits, and vegetables, lots of water) and exercising. -Enc adequate hydration of 5-6 bottles of water per day throughout pregnancy, discussed dehydration in pregnancy signs and symptoms to include NV.  -Pap due today and collected. -Last dental visit unsure, doesn't remember. Encouraged to seek dental care this pregnancy. -EPDS:0, CCNC  forms completed no concerns.  -Routine prenatal labs were conducted on 08/09/24 - WNL discussed with pt during visit today.   3. Chlamydia infection during pregnancy, antepartum -Received tx on 08/16/24, will need TOC on 09/06/24 at next prenatal visit.   4. Iron  deficiency anemia, unspecified iron  deficiency anemia type -Placed Ref, for IV Iron  infusion. -Pt advised to pre-medicate with zofran  prior to taking PO iron  supplements until IV iron  infusion is received.    5. Oligohydramnios, antepartum, fetus 1 of multiple gestation -Hx preterm delivery - referred to MFM 12/16, but no appointment yet scheduled.  - US  OB Comp + 14 Wk; Future  6. Screening for cervical cancer -Routine pap collected, will  notify pt of results accordingly. - IGP, Aptima HPV  7. Nausea/vomiting in pregnancy -Given ED precautions if NV persists or worsens, pt expressed understanding.  -Zofran  rx sent to pharmacy on file.   - ondansetron  (ZOFRAN -ODT) 4 MG disintegrating tablet; Take 1 tablet (4 mg total) by mouth every 8 (eight) hours as needed for nausea or vomiting.  Dispense: 10 tablet; Refill: 0  8. Dysuria during pregnancy, antepartum - Urine Culture - WET PREP FOR TRICH, YEAST, CLUE  9. Yeast infection -Wet Prep: Few yeast  - clotrimazole  (GYNE-LOTRIMIN ) 1 % vaginal cream; Place 1 Applicatorful vaginally at bedtime for 7 days.   Discussed overview of care and coordination with inpatient delivery practices including [facility],  [facility].   Reviewed Centering pregnancy , Patient declined center pregnancy group.  Preterm labor symptoms and general obstetric precautions including but not limited to vaginal bleeding, contractions, leaking of fluid and fetal movement were reviewed in detail with the patient.  Please refer to After Visit Summary for other counseling recommendations.   Return in about 2 weeks (around 09/06/2024) for routine prenatal care.  Future Appointments   Date Time Provider Department Center  09/06/2024  1:20 PM [facility] PROVIDER [facility] None  09/28/2024 12:45 PM [facility] PROVIDER 1 [facility] [place]  09/28/2024  1:00 PM [facility] US  1 [facility] [place]    Due to language barrier, a Spanish interpreter Kemp T.) was present in person during the history-taking, subsequent discussion, and physical exam with this patient.    Hardin GORMAN Pouch, NP "

## 2024-08-23 NOTE — Progress Notes (Addendum)
 Here for provider portion of new OB. Not taking iron, she states it makes her vomit. Needs Tdap today.Hulan Parish, RN   Wet prep reviewed. Per provider, patient given clotrimazole.Hulan Parish, RN

## 2024-08-24 ENCOUNTER — Ambulatory Visit: Payer: Self-pay

## 2024-08-24 ENCOUNTER — Other Ambulatory Visit (HOSPITAL_COMMUNITY): Payer: Self-pay

## 2024-08-26 LAB — URINE CULTURE

## 2024-08-29 LAB — IGP, APTIMA HPV
HPV Aptima: NEGATIVE
PAP Smear Comment: 0

## 2024-09-01 ENCOUNTER — Other Ambulatory Visit: Payer: Self-pay | Admitting: Family Medicine

## 2024-09-01 ENCOUNTER — Telehealth: Payer: Self-pay | Admitting: Family Medicine

## 2024-09-01 DIAGNOSIS — O365930 Maternal care for other known or suspected poor fetal growth, third trimester, not applicable or unspecified: Secondary | ICD-10-CM

## 2024-09-01 DIAGNOSIS — O4100X0 Oligohydramnios, unspecified trimester, not applicable or unspecified: Secondary | ICD-10-CM

## 2024-09-01 NOTE — Telephone Encounter (Signed)
 Call to client with [facility] ID # [phone] to notify her of 09/28/24 [facility] MFM [place] MD consult appt at 1245 and US  appt at 1300. Left message to call with number provided. (09/28/24 is first available MFM appt at both locations). Burnadette Lowers, RN

## 2024-09-01 NOTE — Telephone Encounter (Signed)
 Return call to client with Marlene Norway to notify her of 09/28/24 [facility] MFM [facility] consult appt with MD at 1245 followed by US  at 1300. Left message to call with number provided. Burnadette Lowers, RN

## 2024-09-01 NOTE — Progress Notes (Signed)
 Poor fetal growth affecting management of mother in third trimester, single or unspecified fetus -     US  MFM OB DETAIL +14 WK; Future  Hx preterm delivery in 2016 w/ G3 -     US  MFM OB DETAIL +14 WK; Future  Oligohydramnios, antepartum, single or unspecified fetus -     US  MFM OB DETAIL +14 WK; Future   Dorothyann Helling, MD 09/01/2024  2:05 PM

## 2024-09-02 ENCOUNTER — Encounter: Payer: Self-pay | Admitting: Family Medicine

## 2024-09-02 NOTE — Telephone Encounter (Signed)
 Call to client with Cindy Fox and notified of 09/28/24 MFM consult / US  appt. Client requested earlier appt and counseled 09/28/24 first available appt in [place] and appt would be further out in [place]. Facility address provided to client. Emergency contact added to demographic screen. Questions answered. Burnadette Lowers, RN

## 2024-09-05 NOTE — Addendum Note (Signed)
 Addended by: Henderson Frampton on: 09/05/2024 02:19 PM   Modules accepted: Orders

## 2024-09-06 ENCOUNTER — Telehealth: Payer: Self-pay

## 2024-09-06 ENCOUNTER — Other Ambulatory Visit: Payer: Self-pay

## 2024-09-06 ENCOUNTER — Observation Stay
Admission: EM | Admit: 2024-09-06 | Discharge: 2024-09-07 | Disposition: A | Payer: Self-pay | Attending: Licensed Practical Nurse | Admitting: Licensed Practical Nurse

## 2024-09-06 ENCOUNTER — Ambulatory Visit: Payer: Self-pay

## 2024-09-06 VITALS — BP 111/75 | HR 80 | Temp 98.4°F | Wt 140.6 lb

## 2024-09-06 DIAGNOSIS — O98819 Other maternal infectious and parasitic diseases complicating pregnancy, unspecified trimester: Secondary | ICD-10-CM

## 2024-09-06 DIAGNOSIS — Z3480 Encounter for supervision of other normal pregnancy, unspecified trimester: Secondary | ICD-10-CM

## 2024-09-06 DIAGNOSIS — O219 Vomiting of pregnancy, unspecified: Secondary | ICD-10-CM

## 2024-09-06 DIAGNOSIS — Z3483 Encounter for supervision of other normal pregnancy, third trimester: Secondary | ICD-10-CM

## 2024-09-06 DIAGNOSIS — D509 Iron deficiency anemia, unspecified: Secondary | ICD-10-CM

## 2024-09-06 DIAGNOSIS — Z3A33 33 weeks gestation of pregnancy: Secondary | ICD-10-CM

## 2024-09-06 DIAGNOSIS — O4100X0 Oligohydramnios, unspecified trimester, not applicable or unspecified: Secondary | ICD-10-CM

## 2024-09-06 DIAGNOSIS — A749 Chlamydial infection, unspecified: Secondary | ICD-10-CM

## 2024-09-06 LAB — URINALYSIS, COMPLETE (UACMP) WITH MICROSCOPIC
Bilirubin Urine: NEGATIVE
Glucose, UA: NEGATIVE mg/dL
Hgb urine dipstick: NEGATIVE
Ketones, ur: NEGATIVE mg/dL
Nitrite: NEGATIVE
Protein, ur: 30 mg/dL — AB
Specific Gravity, Urine: 1.031 — ABNORMAL HIGH (ref 1.005–1.030)
WBC, UA: >50 WBC/hpf (ref 0–5)
pH: 5 (ref 5.0–8.0)

## 2024-09-06 LAB — COMPREHENSIVE METABOLIC PANEL WITH GFR
ALT: 6 U/L (ref 0–44)
AST: 15 U/L (ref 15–41)
Albumin: 3.5 g/dL (ref 3.5–5.0)
Alkaline Phosphatase: 157 U/L — ABNORMAL HIGH (ref 38–126)
Anion gap: 13 (ref 5–15)
BUN: 7 mg/dL (ref 6–20)
CO2: 21 mmol/L — ABNORMAL LOW (ref 22–32)
Calcium: 8.7 mg/dL — ABNORMAL LOW (ref 8.9–10.3)
Chloride: 103 mmol/L (ref 98–111)
Creatinine, Ser: 0.47 mg/dL (ref 0.44–1.00)
GFR, Estimated: >60 mL/min
Glucose, Bld: 91 mg/dL (ref 70–99)
Potassium: 3.4 mmol/L — ABNORMAL LOW (ref 3.5–5.1)
Sodium: 136 mmol/L (ref 135–145)
Total Bilirubin: <0.2 mg/dL (ref 0.0–1.2)
Total Protein: 6.7 g/dL (ref 6.5–8.1)

## 2024-09-06 LAB — CBC WITH DIFFERENTIAL/PLATELET
Abs Immature Granulocytes: 0.04 K/uL (ref 0.00–0.07)
Basophils Absolute: 0 K/uL (ref 0.0–0.1)
Basophils Relative: 0 %
Eosinophils Absolute: 0.1 K/uL (ref 0.0–0.5)
Eosinophils Relative: 1 %
HCT: 26.2 % — ABNORMAL LOW (ref 36.0–46.0)
Hemoglobin: 8.1 g/dL — ABNORMAL LOW (ref 12.0–15.0)
Immature Granulocytes: 1 %
Lymphocytes Relative: 30 %
Lymphs Abs: 2.2 K/uL (ref 0.7–4.0)
MCH: 21.9 pg — ABNORMAL LOW (ref 26.0–34.0)
MCHC: 30.9 g/dL (ref 30.0–36.0)
MCV: 70.8 fL — ABNORMAL LOW (ref 80.0–100.0)
Monocytes Absolute: 0.4 K/uL (ref 0.1–1.0)
Monocytes Relative: 6 %
Neutro Abs: 4.4 K/uL (ref 1.7–7.7)
Neutrophils Relative %: 62 %
Platelets: 257 K/uL (ref 150–400)
RBC: 3.7 MIL/uL — ABNORMAL LOW (ref 3.87–5.11)
RDW: 17.9 % — ABNORMAL HIGH (ref 11.5–15.5)
WBC: 7.1 K/uL (ref 4.0–10.5)
nRBC: 0 % (ref 0.0–0.2)

## 2024-09-06 LAB — HEMOGLOBIN, FINGERSTICK: Hemoglobin: 8.4 g/dL — ABNORMAL LOW (ref 11.1–15.9)

## 2024-09-06 MED ORDER — LACTATED RINGERS IV BOLUS
1000.0000 mL | Freq: Once | INTRAVENOUS | Status: AC
  Administered 2024-09-06: 1000 mL via INTRAVENOUS

## 2024-09-06 MED ORDER — DIPHENHYDRAMINE HCL 50 MG/ML IJ SOLN: 25.0000 mg | Freq: Once | INTRAMUSCULAR | Status: DC | PRN

## 2024-09-06 MED ORDER — SODIUM CHLORIDE 0.9 % IV SOLN: 8.0000 mg | Freq: Three times a day (TID) | INTRAVENOUS | Status: DC | PRN

## 2024-09-06 MED ORDER — SODIUM CHLORIDE 0.9 % IV BOLUS: 500.0000 mL | Freq: Once | INTRAVENOUS | Status: DC | PRN

## 2024-09-06 MED ORDER — PROMETHAZINE HCL 25 MG RE SUPP
25.0000 mg | Freq: Four times a day (QID) | RECTAL | Status: DC | PRN
  Administered 2024-09-06: 25 mg via RECTAL
  Filled 2024-09-06 (×2): qty 1

## 2024-09-06 MED ORDER — METHYLPREDNISOLONE SODIUM SUCC 125 MG IJ SOLR: 125.0000 mg | Freq: Once | INTRAMUSCULAR | Status: DC | PRN

## 2024-09-06 MED ORDER — SODIUM CHLORIDE 0.9 % IV SOLN: INTRAVENOUS | Status: DC | PRN

## 2024-09-06 MED ORDER — ALBUTEROL SULFATE (2.5 MG/3ML) 0.083% IN NEBU: 2.5000 mg | INHALATION_SOLUTION | Freq: Once | RESPIRATORY_TRACT | Status: DC | PRN

## 2024-09-06 MED ORDER — IRON SUCROSE 500 MG IVPB - SIMPLE MED
500.0000 mg | INTRAVENOUS | Status: DC
  Administered 2024-09-06: 500 mg via INTRAVENOUS
  Filled 2024-09-06: qty 500

## 2024-09-06 MED ORDER — EPINEPHRINE 0.3 MG/0.3ML IJ SOAJ
0.3000 mg | Freq: Once | INTRAMUSCULAR | Status: DC | PRN
  Filled 2024-09-06: qty 0.3

## 2024-09-06 NOTE — Telephone Encounter (Addendum)
 Patient left for hospital prior to receiving date and time of next return visit. Called and left voice message of time and date of RV (01/27 @ 3pm) . BTHIELE RN

## 2024-09-06 NOTE — Progress Notes (Signed)
 " [facility] [address], [address] [place] [address] Main phone: [phone]  Prenatal Visit  Subjective:  Cindy Fox is a 34 y.o. [phone] at [redacted]w[redacted]d being seen today for ongoing prenatal care.  She is currently monitored for the following issues for this low-risk pregnancy:   Patient Active Problem List   Diagnosis Date Noted   Indication for care in labor and delivery, antepartum 09/06/2024   Abnormal glucose affecting pregnancy 08/22/2024   Chlamydia infection during pregnancy, antepartum 08/15/2024   Oligohydramnios antepartum 08/15/2024   Iron  deficiency anemia 08/10/2024   Pregnancy 08/09/2024   Hx preterm delivery in 2016 w/ G3 08/09/2024   Late prenatal care w/ G4, established ~ 28-30 weeks @[facility]  08/08/2024   Hx IUGR in 2016 w/ G3 05/18/2015   HPI Patient reports has been vomiting everyday, nothing helps. Has tried the vitamin b6 and zofran  which she reports vomitting up and providing her no relief. She is unable to any medication, food or water down. She doesn't recall the last time she was able to eat a meal or keep water down.  Contractions: Irregular (BH). Vag. Bleeding: None.  Movement: Present. Pt Denies leaking of fluid/ROM.   The following portions of the patient's history were reviewed and updated as appropriate: allergies, current medications, past family history, past medical history, past social history, past surgical history and problem list. Problem list updated.  Objective:   Vitals:   09/06/24 1311  BP: 111/75  Pulse: 80  Temp: 98.4 F (36.9 C)  Weight: 140 lb 9.6 oz (63.8 kg)   Total weight gain from pre-pregnancy weight: 3 lb (1.361 kg)  Fetal Status: Fetal Heart Rate (bpm): 134 Fundal Height: 34 cm Movement: Present    Fundal height trends reviewed - appropriate for EGA  General:  Alert, oriented and cooperative. Patient is in no acute distress.  Skin: Skin is warm and dry. No  rash noted.   Cardiovascular: Normal heart rate noted  Respiratory: Normal respiratory effort, no problems with respiration noted  Abdomen: Soft, gravid, appropriate for gestational age.  Pain/Pressure: Present     Pelvic: Cervical exam deferred        Extremities: Normal range of motion.  Edema: None  Mental Status: Normal mood and affect. Normal behavior. Normal judgment and thought content.   Assessment and Plan:  Pregnancy: G4P2103 at [redacted]w[redacted]d  1. [redacted] weeks gestation of pregnancy (Primary) -RV in 2 weeks. - Chlamydia/GC NAA, Confirmation - Hemoglobin, venipuncture  2. Supervision of other normal pregnancy, antepartum  -TWG: 3 lb (1.361 kg) Has gained no weight since last visit on 12/30~ 2 weeks.  - Attempts to take PNV but vomits it up.   3. Iron  deficiency anemia, unspecified iron  deficiency anemia type -hgb 8.4 on 09/07/24, reviewed in clinc. -Unable to tolerate PO iron  supplementation, ordered IV iron  infusion on 08/23/24 awaiting IV iron  infusion appt to be scheduled.  - Hemoglobin, venipuncture  4. Chlamydia infection during pregnancy, antepartum -Received tx 08/16/24 and  reports 30 minutes after she left her visit at [facility]  reports throwing medication up.  -TOC completed today 09/06/24 - pending results.  -Retreatment was not conducted today due to patients inability to tolerate PO food, fluids or medication at this time.  - Will retreat post hospital dc and rehydration with IV fluids.   - Chlamydia/GC NAA, Confirmation  5. Oligohydramnios, antepartum, single or unspecified fetus -Hx of preterm delivery, has MFM appt on 09/28/24, enc pt to  keep appt.   6. Nausea/vomiting in pregnancy -Pt unable to tolerate PO food/fluids, vomits throughout the day.  -  In clinic VSS,  patient appears visbily pale with appropairate skin turgor. Cap refill >2 sec indictaing potential dehydration.  -Given clinical presentation recommended patient to report to L&D for further evaulation and  rehydration via IV fluids.  -Report given to [facility] L&D Triage RN on patient status and expectant management.  -Pt to f/u in clinic at regular interval prenatal appt post dc from L&D.    Preterm labor symptoms and general obstetric precautions including but not limited to vaginal bleeding, contractions, leaking of fluid and fetal movement were reviewed in detail with the patient. Please refer to After Visit Summary for other counseling recommendations.  Return in about 2 weeks (around 09/20/2024) for routine prenatal care.  Future Appointments  Date Time Provider Department Center  09/20/2024  3:00 PM [facility] PROVIDER [facility] None  09/28/2024 12:45 PM [facility] PROVIDER 1 [facility] [place]  09/28/2024  1:00 PM [facility] US  1 [facility] [place]    Heleena Miceli GORMAN Pouch, NP "

## 2024-09-06 NOTE — Discharge Summary (Signed)
 Physician Final Progress Note  Patient ID: Cindy Fox MRN: [id number] DOB/AGE: Oct 08, 1990 34 y.o.  Admit date: 09/06/2024 Admitting provider: Rome LOISE Sanes, MD Discharge date: 09/07/2024   Admission Diagnoses:  1) intrauterine pregnancy at [redacted]w[redacted]d  2) nausea   Discharge Diagnoses:  Principal Problem:   Indication for care in labor and delivery, antepartum  Nausea in pregnancy   History of Present Illness: The patient is a 34 y.o. female [phone] at [redacted]w[redacted]d who presents for IV fluids. She was seen at the [facility] for her ROB, while there she reported having N/V daily and has not been able to keep any food down, it was recommended to her that she come to L and D triage for IV fluids. Cindy Fox reports she has nausea daily all day long, she vomits about 3 times per day. Whenever she eats something she vomits. She has tried B6 and Zofran , they have not helped. Her hgb is noted to be 8.1. She has been taking her Iron  supplement, but not today, the [facility] has ordered an iron  infusion,.  Of note Cindy Fox started her prenatal care at 31 weeks. Per her initial OB visit the following were listed as complications  Abnormal glucose affecting pregnancy 08/22/2024    Chlamydia infection during pregnancy, antepartum 08/15/2024   Oligohydramnios antepartum 08/15/2024   Iron  deficiency anemia 08/10/2024   Pregnancy 08/09/2024   Hx preterm delivery in 2016 w/ G3 08/09/2024   Late prenatal care w/ G4, established ~ 28-30 weeks @[facility]  08/08/2024   Hx IUGR in 2016 w/ G3     Past Medical History:  Diagnosis Date   Anemia    Medical history non-contributory    Preterm labor     Past Surgical History:  Procedure Laterality Date   CYST EXCISION     OVARIAN CYST REMOVAL  2015    Medications Ordered Prior to Encounter[1]  Allergies[2]  Social History   Socioeconomic History   Marital status: Single    Spouse name: Not on file   Number of children: 3   Years of education: Not on file   Highest  education level: 11th grade  Occupational History   Occupation: 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  Tobacco Use   Smoking status: Never    Passive exposure: Never   Smokeless tobacco: Never  Vaping Use   Vaping status: Never Used  Substance and Sexual Activity   Alcohol use: No    Comment: none   Drug use: No   Sexual activity: Yes    Birth control/protection: None    Comment: desires BTL, desires to sign consent next week with provider  Other Topics Concern   Not on file  Social History Narrative   Lives with children   Social Drivers of Health   Tobacco Use: Low Risk (09/06/2024)   Patient History    Smoking Tobacco Use: Never    Smokeless Tobacco Use: Never    Passive Exposure: Never  Financial Resource Strain: Medium Risk (08/15/2024)   Overall Financial Resource Strain (CARDIA)    Difficulty of Paying Living Expenses: Somewhat hard  Food Insecurity: No Food Insecurity (08/15/2024)   Epic    Worried About Radiation Protection Practitioner of Food in the Last Year: Never true    Ran Out of Food in the Last Year: Never true  Transportation Needs: No Transportation Needs (08/15/2024)   Epic    Lack of Transportation (Medical): No    Lack of Transportation (Non-Medical):  No  Physical Activity: Not on file  Stress: Not on file  Social Connections: Unknown (08/15/2024)   Social Connection and Isolation Panel    Frequency of Communication with Friends and Family: Once a week    Frequency of Social Gatherings with Friends and Family: Not on file    Attends Religious Services: Not on file    Active Member of Clubs or Organizations: Not on file    Attends Banker Meetings: Not on file    Marital Status: Not on file  Intimate Partner Violence: Not At Risk (08/08/2024)   Epic    Fear of Current or Ex-Partner: No    Emotionally Abused: No    Physically Abused: No    Sexually Abused: No  Depression (PHQ2-9): Low Risk (08/08/2024)   Depression (PHQ2-9)    PHQ-2  Score: 0  Alcohol Screen: Not on file  Housing: Low Risk (08/15/2024)   Epic    Unable to Pay for Housing in the Last Year: No    Number of Times Moved in the Last Year: 1    Homeless in the Last Year: No  Utilities: Not At Risk (08/15/2024)   Epic    Threatened with loss of utilities: No  Health Literacy: Adequate Health Literacy (08/15/2024)   B1300 Health Literacy    Frequency of need for help with medical instructions: Never    Family History  Problem Relation Age of Onset   Healthy Mother    Healthy Father    Healthy Daughter    Healthy Son    Healthy Maternal Grandmother    Healthy Maternal Grandfather    Healthy Paternal Grandmother    Healthy Paternal Grandfather      ROS  see HPI   Physical Exam: BP 124/73   Pulse 95   Temp 98.5 F (36.9 C)   Wt 62.6 kg   LMP  (LMP Unknown) Comment: unable to determine,irregular periods  BMI 25.65 kg/m   Physical Exam Constitutional:      Appearance: Normal appearance.  HENT:     Mouth/Throat:     Mouth: Mucous membranes are moist.  Cardiovascular:     Rate and Rhythm: Normal rate.  Pulmonary:     Effort: Pulmonary effort is normal.  Abdominal:     Tenderness: There is no abdominal tenderness.     Comments: gravid  Musculoskeletal:     Cervical back: Normal range of motion.  Neurological:     General: No focal deficit present.     Mental Status: She is alert.  Psychiatric:        Mood and Affect: Mood normal.        Thought Content: Thought content normal.    Cindy Fox 1990/11/17 [redacted]w[redacted]d  Fetus A Non-Stress Test Interpretation for 09/07/2024  Indication: triage visit   Fetal Heart Rate A Mode: External Baseline Rate (A): 140 bpm Variability: Moderate Accelerations: 15 x 15 Decelerations: None  Uterine Activity Mode: Toco Contraction Frequency (min): utd- toco readjusted      Consults: None  Significant Findings/ Diagnostic Studies: CBC    Component Value Date/Time   WBC 7.1 09/06/2024  1814   RBC 3.70 (L) 09/06/2024 1814   HGB 8.1 (L) 09/06/2024 1814   HGB 8.1 (L) 08/09/2024 2001   HCT 26.2 (L) 09/06/2024 1814   HCT 26.3 (L) 08/09/2024 2001   PLT 257 09/06/2024 1814   PLT 347 08/09/2024 2001   MCV 70.8 (L) 09/06/2024 1814   MCV 72 (L)  08/09/2024 2001   MCV 81 06/29/2013 0557   MCH 21.9 (L) 09/06/2024 1814   MCHC 30.9 09/06/2024 1814   RDW 17.9 (H) 09/06/2024 1814   RDW 14.4 08/09/2024 2001   RDW 13.6 06/29/2013 0557   LYMPHSABS 2.2 09/06/2024 1814   LYMPHSABS 2.5 08/09/2024 2001   LYMPHSABS 4.4 (H) 06/29/2013 0557   MONOABS 0.4 09/06/2024 1814   MONOABS 0.6 06/29/2013 0557   EOSABS 0.1 09/06/2024 1814   EOSABS 0.1 08/09/2024 2001   EOSABS 0.2 06/29/2013 0557   BASOSABS 0.0 09/06/2024 1814   BASOSABS 0.0 08/09/2024 2001   BASOSABS 0.1 06/29/2013 0557     Procedures: RNST  Hospital Course: The patient was admitted to Labor and Delivery Triage for observation. She had a RNST, she was given an IV fluid bolus and phenergan  suppository, She was recommended an Iron  infusion by the [facility], offered to do infusion today or have done outpatient, patient choose to receive an Iron  infusion while in triage. Overtime pt reported nausea improved and was able to eat small snacks and she wished to go home. Pt discharged home to follow up with her OB provider.   Discharge Condition: stable  Disposition: Discharge disposition: 01-Home or Self Care       Diet: Regular diet  Discharge Activity: Activity as tolerated   Allergies as of 09/07/2024   No Known Allergies      Medication List     TAKE these medications    ferrous sulfate  325 (65 FE) MG EC tablet Take 1 tablet (325 mg total) by mouth every other day.   multivitamin-prenatal 27-0.8 MG Tabs tablet Take 1 tablet by mouth daily at 12 noon.   ondansetron  4 MG disintegrating tablet Commonly known as: ZOFRAN -ODT Take 1 tablet (4 mg total) by mouth every 8 (eight) hours as needed for nausea or vomiting.    promethazine  25 MG suppository Commonly known as: PHENERGAN  Place 1 suppository (25 mg total) rectally every 6 (six) hours as needed for nausea or vomiting.         Total time spent taking care of this patient: 20 minutes  Signed: JINNIE HERO Maecy Podgurski, CNM  09/07/2024, 1:27 AM     [1]  No current facility-administered medications on file prior to encounter.   Current Outpatient Medications on File Prior to Encounter  Medication Sig Dispense Refill   ferrous sulfate  325 (65 FE) MG EC tablet Take 1 tablet (325 mg total) by mouth every other day.     Prenatal Vit-Fe Fumarate-FA (MULTIVITAMIN-PRENATAL) 27-0.8 MG TABS tablet Take 1 tablet by mouth daily at 12 noon.     ondansetron  (ZOFRAN -ODT) 4 MG disintegrating tablet Take 1 tablet (4 mg total) by mouth every 8 (eight) hours as needed for nausea or vomiting. 10 tablet 0  [2] No Known Allergies

## 2024-09-06 NOTE — Progress Notes (Unsigned)
 Provider aware of in house hgb result and patient complaints of nausea and vomiting. States she vomited 30 minutes when treated previously for chlamydia and vomits after taking PNVs and Iron  as well. Chlamydia sample self collected. BTHIELE RN

## 2024-09-06 NOTE — OB Triage Note (Signed)
 Patient reported to the unit with the complains of nausea  and vomiting since she got pregnant three months ago. She stated she could not hold food in and throws up 3x per day She denies having any contractions, No vaginal bleeding, no leaking fluids. She states positive for fetal movement . EFM and TOCO in place and monitoring . Midwife informed

## 2024-09-07 ENCOUNTER — Ambulatory Visit: Payer: Self-pay

## 2024-09-07 MED ORDER — PROMETHAZINE HCL 25 MG RE SUPP: 25.0000 mg | Freq: Four times a day (QID) | RECTAL | 0 refills | Status: AC | PRN

## 2024-09-07 NOTE — OB Triage Note (Signed)
 Pt being discharged home by L. Dominic, CNM. NST is reactive. Pt disappointed but understands plan. Pt knows to return for LOF, increased painful ctx's, decreased fetal movement, increased bleeding. Discharge instructions handed to pt and pt verbalized understanding. Leaving ambulatory with SO.

## 2024-09-07 NOTE — Progress Notes (Signed)
 Results reviewed in clinic.   Hgb trending up slighly up from last check. IV iron  infusion discussion had with pt in clinic order was placed. Awaiting IV iron  infusion appt to be scheduled. Pt to continue PO iron  supplementation until infusion.   Hardin Pouch, [facility]

## 2024-09-08 ENCOUNTER — Telehealth: Payer: Self-pay

## 2024-09-08 LAB — CHLAMYDIA/GC NAA, CONFIRMATION
Chlamydia trachomatis, NAA: NEGATIVE
Neisseria gonorrhoeae, NAA: NEGATIVE

## 2024-09-08 NOTE — Telephone Encounter (Signed)
 Per MNT staff,  [facility] staff has reached out twice to schedule MNT/WIC appointments, on 08/24/24 and 08/26/24.  Voicemail messages were left each time  -Will discuss with pt at RV.   Hardin Pouch, [facility]

## 2024-09-09 NOTE — Telephone Encounter (Addendum)
 Patient called and left message to call [facility] d/t needs earlier return appointment. [facility] Spanish Interpreter utilized for phone call Emmie) [facility] RN

## 2024-09-15 ENCOUNTER — Telehealth: Payer: Self-pay

## 2024-09-15 NOTE — Telephone Encounter (Signed)
 Reviewed Pap result from 08/23/24- ASCUS, HPV negative.  -Trichomonas Vaginalis present on pap specimen will tx at RV per SO.    - Per [facility] guidelines will need 3 year pap f/u.   Hardin Pouch, [facility]

## 2024-09-15 NOTE — Telephone Encounter (Signed)
 Has [facility] scheduled for 09/16/24 @ 3:30. Niels Devonshire, RN

## 2024-09-16 ENCOUNTER — Ambulatory Visit: Payer: Self-pay

## 2024-09-16 ENCOUNTER — Telehealth: Payer: Self-pay

## 2024-09-16 NOTE — Progress Notes (Signed)
 Notified via staff message of pap results, ascus/hpv negative. Recommended f/u in 3 years per Alexia NP.  Pap letter sent to pt. Larraine JONELLE Novak, RN

## 2024-09-16 NOTE — Telephone Encounter (Signed)
 TC x 2 to client to reschedule missed appointment today. Left VM. TC to emergency contact, unable to LM. [address] [employer], [place] [id number]. SABRAIzetta Parish, RN

## 2024-09-20 ENCOUNTER — Ambulatory Visit: Payer: Self-pay

## 2024-09-21 ENCOUNTER — Telehealth (HOSPITAL_COMMUNITY): Payer: Self-pay

## 2024-09-21 NOTE — Telephone Encounter (Signed)
 Auth Submission: APPROVED Site of care: Site of care: [facility] Payer: PAP Free Drug Medication & CPT/J Code(s) submitted: Feraheme ([id number]) Diagnosis Code:  Units/visits requested: 510mg  x 2 doses Approval from: 09/20/24 to 09/20/25

## 2024-09-22 ENCOUNTER — Telehealth: Payer: Self-pay | Admitting: Family Medicine

## 2024-09-22 NOTE — Telephone Encounter (Signed)
 TC to patient to let her know that her Pap smear results included Trichomonas and she will need to be treated. Appointment scheduled for tomorrow morning and patient states her car is broken and she will try to find a ride. Patient counseled that her partner needs to make an appointment to be treated and she states she doesn't have a current partner. She states she will call if she can't come, and will reschedule. [address], [place], [id number]. We were cut off somehow and client was asking why she keeps getting infections. TC with another interpreter, John, [phone], and no answer, LM on VM.SABRAIzetta Parish, RN

## 2024-09-23 ENCOUNTER — Ambulatory Visit: Payer: Self-pay

## 2024-09-23 NOTE — Telephone Encounter (Signed)
 Patient did not keep appointment today. TC to patient to reschedule appointment for treatment for Trich. LM for patient with number to call. Tried cell number, unable to LM. Interpreter, M. [place].Izetta Parish, RN

## 2024-09-27 ENCOUNTER — Observation Stay
Admission: EM | Admit: 2024-09-27 | Discharge: 2024-09-28 | Disposition: A | Discharge status: Home / Self Care | Payer: Self-pay | Attending: Certified Nurse Midwife | Admitting: Certified Nurse Midwife

## 2024-09-27 ENCOUNTER — Encounter: Payer: Self-pay | Admitting: Obstetrics

## 2024-09-27 DIAGNOSIS — O23593 Infection of other part of genital tract in pregnancy, third trimester: Secondary | ICD-10-CM | POA: Insufficient documentation

## 2024-09-27 DIAGNOSIS — A749 Chlamydial infection, unspecified: Secondary | ICD-10-CM

## 2024-09-27 DIAGNOSIS — Z3A35 35 weeks gestation of pregnancy: Secondary | ICD-10-CM

## 2024-09-27 DIAGNOSIS — O0933 Supervision of pregnancy with insufficient antenatal care, third trimester: Principal | ICD-10-CM

## 2024-09-27 DIAGNOSIS — Z3A36 36 weeks gestation of pregnancy: Secondary | ICD-10-CM | POA: Insufficient documentation

## 2024-09-27 DIAGNOSIS — O4100X0 Oligohydramnios, unspecified trimester, not applicable or unspecified: Secondary | ICD-10-CM

## 2024-09-27 DIAGNOSIS — O4703 False labor before 37 completed weeks of gestation, third trimester: Principal | ICD-10-CM | POA: Insufficient documentation

## 2024-09-27 DIAGNOSIS — A5901 Trichomonal vulvovaginitis: Secondary | ICD-10-CM | POA: Insufficient documentation

## 2024-09-27 LAB — URINALYSIS, ROUTINE W REFLEX MICROSCOPIC
Bilirubin Urine: NEGATIVE
Glucose, UA: NEGATIVE mg/dL
Hgb urine dipstick: NEGATIVE
Ketones, ur: NEGATIVE mg/dL
Nitrite: NEGATIVE
Protein, ur: NEGATIVE mg/dL
Specific Gravity, Urine: 1.014 (ref 1.005–1.030)
pH: 6 (ref 5.0–8.0)

## 2024-09-27 LAB — WET PREP, GENITAL
Clue Cells Wet Prep HPF POC: NONE SEEN
Sperm: NONE SEEN
WBC, Wet Prep HPF POC: <10
Yeast Wet Prep HPF POC: NONE SEEN

## 2024-09-27 LAB — RUPTURE OF MEMBRANE (ROM)PLUS: Rom Plus: NEGATIVE

## 2024-09-27 MED ORDER — METRONIDAZOLE 500 MG PO TABS
500.0000 mg | ORAL_TABLET | Freq: Two times a day (BID) | ORAL | Status: DC
  Administered 2024-09-28: 500 mg via ORAL
  Filled 2024-09-27: qty 1

## 2024-09-27 MED ORDER — LACTATED RINGERS IV SOLN: INTRAVENOUS | Status: DC

## 2024-09-27 MED ORDER — METRONIDAZOLE 500 MG PO TABS: 500.0000 mg | ORAL_TABLET | Freq: Two times a day (BID) | ORAL | 0 refills | Status: AC

## 2024-09-27 MED ORDER — ACETAMINOPHEN 500 MG PO TABS
1000.0000 mg | ORAL_TABLET | Freq: Once | ORAL | Status: AC
  Administered 2024-09-27: 1000 mg via ORAL
  Filled 2024-09-27: qty 2

## 2024-09-27 MED ORDER — LACTATED RINGERS IV SOLN
500.0000 mL | INTRAVENOUS | Status: DC | PRN
  Administered 2024-09-27: 1000 mL via INTRAVENOUS

## 2024-09-27 MED ORDER — LIDOCAINE HCL (PF) 1 % IJ SOLN: 30.0000 mL | INTRAMUSCULAR | Status: DC | PRN

## 2024-09-27 NOTE — OB Triage Provider Note (Signed)
" ° °  L&D OB Triage Note  SUBJECTIVE Cindy Fox is a 34 y.o. [phone] female at [redacted]w[redacted]d, EDD Estimated Date of Delivery: 10/20/24 who receives her care with the health department. She presented to triage with complaints of contractions and vaginal discharge since yesterday.  She has history of positive trichomoniasis that has not yet been adequately treated due to transportation issues. She denies vaginal bleeding and is feeling good movement.   OB History  Gravida Para Term Preterm AB Living  4 3 2 1  0 3  SAB IAB Ectopic Multiple Live Births  0 0 0 0 3    # Outcome Date GA Lbr Len/2nd Weight Sex Type Anes PTL Lv  4 Current           3 Preterm 05/22/15 [redacted]w[redacted]d / 00:06 2268 g M  EPI Y LIV     Birth Comments: initially ectopic pregnancy     Complications: IUGR (intrauterine growth restriction) affecting care of mother     Name: Cindy Fox  2 Term 06/29/13 [redacted]w[redacted]d  3402 g M Vag-Spont None N LIV  1 Term 05/25/11 [redacted]w[redacted]d  3317 g M Vag-Spont EPI N LIV    Medications Prior to Admission  Medication Sig Dispense Refill Last Dose/Taking   Prenatal Vit-Fe Fumarate-FA (MULTIVITAMIN-PRENATAL) 27-0.8 MG TABS tablet Take 1 tablet by mouth daily at 12 noon.   09/27/2024   ferrous sulfate  325 (65 FE) MG EC tablet Take 1 tablet (325 mg total) by mouth every other day. (Patient not taking: Reported on 09/27/2024)   Not Taking   ondansetron  (ZOFRAN -ODT) 4 MG disintegrating tablet Take 1 tablet (4 mg total) by mouth every 8 (eight) hours as needed for nausea or vomiting. (Patient not taking: Reported on 09/27/2024) 10 tablet 0 Not Taking   promethazine  (PHENERGAN ) 25 MG suppository Place 1 suppository (25 mg total) rectally every 6 (six) hours as needed for nausea or vomiting. (Patient not taking: Reported on 09/27/2024) 12 each 0 Not Taking     OBJECTIVE  Nursing Evaluation:   BP 122/72   Temp 98.2 F (36.8 C) (Oral)   Resp 18   Ht 5' 1.5 (1.562 m)   Wt 61.2 kg   LMP  (LMP Unknown) Comment: unable to  determine,irregular periods  BMI 25.09 kg/m    Findings:   contractions      NST was performed and has been reviewed by me.  NST INTERPRETATION: Category I  Mode: External Baseline Rate (A): 145 bpm Variability: Moderate Accelerations: 15 x 15 Decelerations: None     Contraction Frequency (min): occ  ASSESSMENT Impression:  1.  Pregnancy:  [id number] at 108w5d , EDD Estimated Date of Delivery: 10/20/24 2.  Reassuring fetal and maternal status 3.  Contractions that improved with IV hydration, pain medication  4. Pt declined cervical exam  5. Trichomoniasis infection 6. ROM plus negative     PLAN 1. Current condition and above findings reviewed.  Reassuring fetal and maternal condition. Pt offered to stay overnight so she can receive her medication through outpatient pharmacy. She declines stating she will have a ride tomorrow to get her prescription. She is requesting to go home tonight.  2. Discharge home with standard labor precautions given to return to L&D or call the office for problems. 3. Continue routine prenatal care.  Zelda Hummer, CNM    "

## 2024-09-27 NOTE — OB Triage Note (Signed)
 Patient discharged by Sebastian, CNM. First dose of Flagyl  administered. Discharge instructions and return precautions discussed, patient verbalized understanding to all. Patient ambulated off unit accompanied by sister, discharge teachings in hand.

## 2024-09-27 NOTE — OB Triage Note (Incomplete)
 Patient discharged by Sebastian, CNM. First dose of Flagyl  administered. Discharge instructions and return precautions discussed, patient verbalized understanding to all. Patient ambulated off unit accompanied by sister, discharge teachings in hand.

## 2024-09-27 NOTE — OB Triage Note (Signed)
 Patient is a 34 yo, G4P3, at 36 weeks 5 days. Patient presents with complaints of contractions and leaking of thin, clear fluids vaginally since yesterday morning around 0800. Rating pain 9/10 that does not resolve with position change or rest. Abdomen is tender to light palpation. Patient has not taken any medications to assist pain. Patient aware of positive trichomonas results on 1/29 [facility] but has been unable to obtain treatment due to lack of transportation. Patient denies any vaginal bleeding. Patient reports FM. Monitors applied and assessing. VSS. Initial fetal heart tone 145. Sebastian, CNM notified of patients arrival to unit. Plan to collect vaginal cultures, ROM+, U/A, administer 1L fluid bolus and Tylenol .

## 2024-09-27 NOTE — Telephone Encounter (Signed)
 Patient called using [facility] Spanish Interpreter Emmie). . Patient did not answer phone therefore left message to return call to schedule appointment. Called mobile # as well but this phone was not taking messages. BTHIELE RN

## 2024-09-28 ENCOUNTER — Other Ambulatory Visit: Payer: Self-pay

## 2024-09-28 ENCOUNTER — Ambulatory Visit: Payer: Self-pay | Admitting: Maternal & Fetal Medicine

## 2024-09-28 DIAGNOSIS — A749 Chlamydial infection, unspecified: Secondary | ICD-10-CM

## 2024-09-28 DIAGNOSIS — O4100X0 Oligohydramnios, unspecified trimester, not applicable or unspecified: Secondary | ICD-10-CM

## 2024-09-28 DIAGNOSIS — Z3A35 35 weeks gestation of pregnancy: Secondary | ICD-10-CM

## 2024-09-28 DIAGNOSIS — O0933 Supervision of pregnancy with insufficient antenatal care, third trimester: Secondary | ICD-10-CM

## 2024-09-29 ENCOUNTER — Encounter: Payer: Self-pay | Admitting: Family Medicine

## 2024-10-04 ENCOUNTER — Ambulatory Visit: Payer: Self-pay

## 2024-10-11 ENCOUNTER — Ambulatory Visit: Payer: Self-pay
